# Patient Record
Sex: Female | Born: 1970 | Race: White | Hispanic: No | Smoking: Never smoker
Health system: Southern US, Community
[De-identification: ages and names within clinical notes are randomized; demographics above are authoritative.]

## PROBLEM LIST (undated history)

## (undated) DIAGNOSIS — C801 Malignant (primary) neoplasm, unspecified: Secondary | ICD-10-CM

## (undated) HISTORY — PX: ABDOMINAL HYSTERECTOMY: SHX81

## (undated) HISTORY — PX: LEG TENDON SURGERY: SHX1004

---

## 2003-12-29 ENCOUNTER — Other Ambulatory Visit: Admission: RE | Admit: 2003-12-29 | Discharge: 2003-12-29 | Payer: Self-pay | Admitting: Family Medicine

## 2005-02-15 ENCOUNTER — Ambulatory Visit (HOSPITAL_COMMUNITY): Admission: RE | Admit: 2005-02-15 | Discharge: 2005-02-15 | Payer: Self-pay | Admitting: *Deleted

## 2005-06-01 ENCOUNTER — Ambulatory Visit (HOSPITAL_COMMUNITY): Admission: RE | Admit: 2005-06-01 | Discharge: 2005-06-01 | Payer: Self-pay | Admitting: *Deleted

## 2008-08-19 ENCOUNTER — Ambulatory Visit (HOSPITAL_COMMUNITY): Admission: RE | Admit: 2008-08-19 | Discharge: 2008-08-19 | Payer: Self-pay | Admitting: Family Medicine

## 2008-10-08 ENCOUNTER — Encounter: Admission: RE | Admit: 2008-10-08 | Discharge: 2008-10-08 | Payer: Self-pay | Admitting: Family Medicine

## 2009-06-03 ENCOUNTER — Emergency Department (HOSPITAL_COMMUNITY): Admission: EM | Admit: 2009-06-03 | Discharge: 2009-06-03 | Payer: Self-pay | Admitting: Emergency Medicine

## 2010-12-26 ENCOUNTER — Encounter
Admission: RE | Admit: 2010-12-26 | Discharge: 2010-12-26 | Payer: Self-pay | Source: Home / Self Care | Attending: Family Medicine | Admitting: Family Medicine

## 2010-12-31 ENCOUNTER — Encounter: Payer: Self-pay | Admitting: *Deleted

## 2010-12-31 ENCOUNTER — Encounter: Payer: Self-pay | Admitting: Family Medicine

## 2011-04-27 NOTE — Op Note (Signed)
Mallory Taylor, Mallory Taylor             ACCOUNT NO.:  [id number]   MEDICAL RECORD NO.:  [id number]          PATIENT TYPE:  AMB   LOCATION:  SDC                           FACILITY:  [facility]   PHYSICIAN:  Wauna B. Earlene Plater, M.D.  DATE OF BIRTH:  May 05, 1971   DATE OF PROCEDURE:  02/15/2005  DATE OF DISCHARGE:                                 OPERATIVE REPORT   PREOPERATIVE DIAGNOSIS:  Desires tubal sterilization.   POSTOPERATIVE DIAGNOSIS:  Desires tubal sterilization.   PROCEDURE:  Essure tubal sterilization.   ASSISTANT:  None.   ANESTHESIA:  LMA.   SPECIMENS:  None.   ESTIMATED BLOOD LOSS:  Minimal.   FLUID DEFICIT:  Minimal.   FINDINGS:  Normal-appearing intrauterine cavity.  Good endometrial  suppression.  Good visualization of both tubal ostia.  Post Essureplacement,  three coils visible on the left, one coil visible on the right.  No  complications.   INDICATION:  The patient desires permanent tubal sterilization.  After  discussing the various options that are available to her, the patient  decided to proceed with the Essure procedure.  We discussed the need for 3  months of back up and the FDA-mandated postop HSG to confirm tubal  occlusion.  The patient was advised of the risks of surgery including  infection, bleeding, uterine perforation, damage to surrounding organs.   PROCEDURE:  The patient was taken to the operating room with LMA general  anesthesia in place.  She was prepped and draped in the standard fashion.  An in-and-out catheter was used to empty the bladder.   Exam under anesthesia showed an anteverted normal-sized uterus with no  adnexal masses.   A single-tooth tenaculum was attached to the anterior lip of the cervix.  The 30 degree scope was inserted and the endometrium distended with  sorbitol.  The tubal ostia and uterine cavity were inspected and found to be  normal.  Left tubal ostia was first approached.  The Essure was inserted up  to the black depth  mark.  It was deployed using the standard maneuvers.  Three coils were visible after placement.  The procedure was repeated on the  right side.  There was substantial corkscrew forward movement of the coils  as the coil was deployed.  This left one coil visible after placement.   The patient tolerated the procedure well.  There were no complications.  She  was taken to the recovery room awake and in stable condition.      WBD/MEDQ  D:  02/15/2005  T:  02/15/2005  Job:  [id number]

## 2012-02-05 ENCOUNTER — Other Ambulatory Visit: Payer: Self-pay | Admitting: Family Medicine

## 2012-02-05 DIAGNOSIS — N6310 Unspecified lump in the right breast, unspecified quadrant: Secondary | ICD-10-CM

## 2012-02-12 ENCOUNTER — Other Ambulatory Visit: Payer: Self-pay

## 2012-02-13 ENCOUNTER — Ambulatory Visit
Admission: RE | Admit: 2012-02-13 | Discharge: 2012-02-13 | Disposition: A | Discharge status: Home / Self Care | Payer: BC Managed Care – PPO | Source: Ambulatory Visit | Attending: Family Medicine | Admitting: Family Medicine

## 2012-02-13 DIAGNOSIS — N6310 Unspecified lump in the right breast, unspecified quadrant: Secondary | ICD-10-CM

## 2012-10-03 ENCOUNTER — Other Ambulatory Visit: Payer: Self-pay | Admitting: Family Medicine

## 2012-10-03 DIAGNOSIS — N6310 Unspecified lump in the right breast, unspecified quadrant: Secondary | ICD-10-CM

## 2012-10-24 ENCOUNTER — Ambulatory Visit
Admission: RE | Admit: 2012-10-24 | Discharge: 2012-10-24 | Disposition: A | Discharge status: Home / Self Care | Payer: BC Managed Care – PPO | Source: Ambulatory Visit | Attending: Family Medicine | Admitting: Family Medicine

## 2012-10-24 DIAGNOSIS — N6310 Unspecified lump in the right breast, unspecified quadrant: Secondary | ICD-10-CM

## 2013-04-08 ENCOUNTER — Other Ambulatory Visit: Payer: Self-pay | Admitting: Family Medicine

## 2013-04-08 DIAGNOSIS — N63 Unspecified lump in unspecified breast: Secondary | ICD-10-CM

## 2013-04-23 ENCOUNTER — Ambulatory Visit
Admission: RE | Admit: 2013-04-23 | Discharge: 2013-04-23 | Disposition: A | Discharge status: Home / Self Care | Payer: BC Managed Care – PPO | Source: Ambulatory Visit | Attending: Family Medicine | Admitting: Family Medicine

## 2013-04-23 DIAGNOSIS — N63 Unspecified lump in unspecified breast: Secondary | ICD-10-CM

## 2013-10-02 ENCOUNTER — Other Ambulatory Visit: Payer: Self-pay | Admitting: Family Medicine

## 2013-10-02 DIAGNOSIS — N63 Unspecified lump in unspecified breast: Secondary | ICD-10-CM

## 2013-10-26 ENCOUNTER — Ambulatory Visit
Admission: RE | Admit: 2013-10-26 | Discharge: 2013-10-26 | Disposition: A | Discharge status: Home / Self Care | Payer: BC Managed Care – PPO | Source: Ambulatory Visit | Attending: Family Medicine | Admitting: Family Medicine

## 2013-10-26 DIAGNOSIS — N63 Unspecified lump in unspecified breast: Secondary | ICD-10-CM

## 2014-05-18 ENCOUNTER — Other Ambulatory Visit: Payer: Self-pay | Admitting: Family Medicine

## 2014-05-18 DIAGNOSIS — N6310 Unspecified lump in the right breast, unspecified quadrant: Secondary | ICD-10-CM

## 2014-05-28 ENCOUNTER — Ambulatory Visit
Admission: RE | Admit: 2014-05-28 | Discharge: 2014-05-28 | Disposition: A | Discharge status: Home / Self Care | Payer: BC Managed Care – PPO | Source: Ambulatory Visit | Attending: Family Medicine | Admitting: Family Medicine

## 2014-05-28 DIAGNOSIS — N6310 Unspecified lump in the right breast, unspecified quadrant: Secondary | ICD-10-CM

## 2015-04-13 ENCOUNTER — Other Ambulatory Visit: Payer: Self-pay | Admitting: Physician Assistant

## 2015-04-13 DIAGNOSIS — Z1231 Encounter for screening mammogram for malignant neoplasm of breast: Secondary | ICD-10-CM

## 2015-05-25 ENCOUNTER — Other Ambulatory Visit: Payer: Self-pay | Admitting: Physician Assistant

## 2015-05-25 DIAGNOSIS — R921 Mammographic calcification found on diagnostic imaging of breast: Secondary | ICD-10-CM

## 2015-05-30 ENCOUNTER — Ambulatory Visit
Admission: RE | Admit: 2015-05-30 | Discharge: 2015-05-30 | Disposition: A | Discharge status: Home / Self Care | Payer: BLUE CROSS/BLUE SHIELD | Source: Ambulatory Visit | Attending: Physician Assistant | Admitting: Physician Assistant

## 2015-05-30 ENCOUNTER — Ambulatory Visit: Payer: Self-pay

## 2015-05-30 DIAGNOSIS — R921 Mammographic calcification found on diagnostic imaging of breast: Secondary | ICD-10-CM

## 2015-08-04 ENCOUNTER — Other Ambulatory Visit: Payer: Self-pay

## 2015-08-04 ENCOUNTER — Other Ambulatory Visit: Payer: Self-pay | Admitting: Family Medicine

## 2015-08-04 DIAGNOSIS — R921 Mammographic calcification found on diagnostic imaging of breast: Secondary | ICD-10-CM

## 2015-12-01 ENCOUNTER — Ambulatory Visit
Admission: RE | Admit: 2015-12-01 | Discharge: 2015-12-01 | Disposition: A | Discharge status: Home / Self Care | Payer: BLUE CROSS/BLUE SHIELD | Source: Ambulatory Visit | Attending: Family Medicine | Admitting: Family Medicine

## 2015-12-01 DIAGNOSIS — R921 Mammographic calcification found on diagnostic imaging of breast: Secondary | ICD-10-CM

## 2016-03-07 ENCOUNTER — Other Ambulatory Visit: Payer: Self-pay | Admitting: Nurse Practitioner

## 2016-03-07 DIAGNOSIS — N63 Unspecified lump in unspecified breast: Secondary | ICD-10-CM

## 2016-03-14 ENCOUNTER — Ambulatory Visit
Admission: RE | Admit: 2016-03-14 | Discharge: 2016-03-14 | Disposition: A | Discharge status: Home / Self Care | Payer: BLUE CROSS/BLUE SHIELD | Source: Ambulatory Visit | Attending: Nurse Practitioner | Admitting: Nurse Practitioner

## 2016-03-14 ENCOUNTER — Other Ambulatory Visit: Payer: Self-pay | Admitting: Nurse Practitioner

## 2016-03-14 DIAGNOSIS — N63 Unspecified lump in unspecified breast: Secondary | ICD-10-CM

## 2016-07-17 ENCOUNTER — Other Ambulatory Visit: Payer: Self-pay | Admitting: Nurse Practitioner

## 2016-07-17 DIAGNOSIS — R921 Mammographic calcification found on diagnostic imaging of breast: Secondary | ICD-10-CM

## 2016-07-24 ENCOUNTER — Ambulatory Visit
Admission: RE | Admit: 2016-07-24 | Discharge: 2016-07-24 | Disposition: A | Discharge status: Home / Self Care | Payer: BLUE CROSS/BLUE SHIELD | Source: Ambulatory Visit | Attending: Nurse Practitioner | Admitting: Nurse Practitioner

## 2016-07-24 DIAGNOSIS — R921 Mammographic calcification found on diagnostic imaging of breast: Secondary | ICD-10-CM

## 2017-08-14 ENCOUNTER — Other Ambulatory Visit: Payer: Self-pay | Admitting: Nurse Practitioner

## 2017-08-14 DIAGNOSIS — Z1231 Encounter for screening mammogram for malignant neoplasm of breast: Secondary | ICD-10-CM

## 2017-08-19 ENCOUNTER — Ambulatory Visit
Admission: RE | Admit: 2017-08-19 | Discharge: 2017-08-19 | Disposition: A | Discharge status: Home / Self Care | Payer: BLUE CROSS/BLUE SHIELD | Source: Ambulatory Visit | Attending: Nurse Practitioner | Admitting: Nurse Practitioner

## 2017-08-19 ENCOUNTER — Encounter: Payer: Self-pay | Admitting: Radiology

## 2017-08-19 DIAGNOSIS — Z1231 Encounter for screening mammogram for malignant neoplasm of breast: Secondary | ICD-10-CM

## 2018-08-08 ENCOUNTER — Other Ambulatory Visit: Payer: Self-pay | Admitting: Nurse Practitioner

## 2018-08-08 DIAGNOSIS — Z1231 Encounter for screening mammogram for malignant neoplasm of breast: Secondary | ICD-10-CM

## 2018-09-11 ENCOUNTER — Ambulatory Visit
Admission: RE | Admit: 2018-09-11 | Discharge: 2018-09-11 | Disposition: A | Discharge status: Home / Self Care | Payer: BLUE CROSS/BLUE SHIELD | Source: Ambulatory Visit | Attending: Nurse Practitioner | Admitting: Nurse Practitioner

## 2018-09-11 DIAGNOSIS — Z1231 Encounter for screening mammogram for malignant neoplasm of breast: Secondary | ICD-10-CM

## 2019-11-27 ENCOUNTER — Other Ambulatory Visit: Payer: Self-pay

## 2019-11-27 ENCOUNTER — Emergency Department (HOSPITAL_BASED_OUTPATIENT_CLINIC_OR_DEPARTMENT_OTHER): Payer: BC Managed Care – PPO

## 2019-11-27 ENCOUNTER — Other Ambulatory Visit: Payer: Self-pay | Admitting: Nurse Practitioner

## 2019-11-27 ENCOUNTER — Encounter (HOSPITAL_COMMUNITY): Payer: Self-pay | Admitting: Emergency Medicine

## 2019-11-27 ENCOUNTER — Emergency Department (HOSPITAL_COMMUNITY)
Admission: EM | Admit: 2019-11-27 | Discharge: 2019-11-27 | Disposition: A | Discharge status: Home / Self Care | Payer: BC Managed Care – PPO | Attending: Emergency Medicine | Admitting: Emergency Medicine

## 2019-11-27 DIAGNOSIS — M79609 Pain in unspecified limb: Secondary | ICD-10-CM | POA: Diagnosis not present

## 2019-11-27 DIAGNOSIS — Z9889 Other specified postprocedural states: Secondary | ICD-10-CM | POA: Insufficient documentation

## 2019-11-27 DIAGNOSIS — I82451 Acute embolism and thrombosis of right peroneal vein: Secondary | ICD-10-CM | POA: Insufficient documentation

## 2019-11-27 DIAGNOSIS — M79604 Pain in right leg: Secondary | ICD-10-CM | POA: Diagnosis present

## 2019-11-27 DIAGNOSIS — Z1231 Encounter for screening mammogram for malignant neoplasm of breast: Secondary | ICD-10-CM

## 2019-11-27 LAB — I-STAT CHEM 8, ED
BUN: 14 mg/dL (ref 6–20)
Calcium, Ion: 1.31 mmol/L (ref 1.15–1.40)
Chloride: 108 mmol/L (ref 98–111)
Creatinine, Ser: 1 mg/dL (ref 0.44–1.00)
Glucose, Bld: 98 mg/dL (ref 70–99)
HCT: 40 % (ref 36.0–46.0)
Hemoglobin: 13.6 g/dL (ref 12.0–15.0)
Potassium: 3.9 mmol/L (ref 3.5–5.1)
Sodium: 142 mmol/L (ref 135–145)
TCO2: 26 mmol/L (ref 22–32)

## 2019-11-27 MED ORDER — RIVAROXABAN 20 MG PO TABS: 20.0000 mg | ORAL_TABLET | Freq: Every day | ORAL | Status: DC

## 2019-11-27 MED ORDER — RIVAROXABAN 15 MG PO TABS
15.0000 mg | ORAL_TABLET | Freq: Two times a day (BID) | ORAL | Status: DC
  Administered 2019-11-27: 22:00:00 15 mg via ORAL
  Filled 2019-11-27 (×2): qty 1

## 2019-11-27 MED ORDER — RIVAROXABAN (XARELTO) VTE STARTER PACK (15 & 20 MG): ORAL_TABLET | ORAL | 0 refills | Status: AC

## 2019-11-27 NOTE — Progress Notes (Signed)
[facility] for Xarelto Indication: DVT  No Known Allergies  Patient Measurements:  No height, weight charted   Vital Signs: Temp: 98.4 F (36.9 C) (12/18 1811) Temp Source: Oral (12/18 1811) BP: 158/96 (12/18 1811) Pulse Rate: 110 (12/18 1811)  Labs: Recent Labs    11/27/19 2020  HGB 13.6  HCT 40.0  CREATININE 1.00   CrCl cannot be calculated (Unknown ideal weight.).  Medical History: History reviewed. No pertinent past medical history.  Medications:  Scheduled:   Assessment: 48 yoF with new RLE DVT. Previous tendon repair, plantar fasciatis 12/8. Xarelto for VTE  Goal of Therapy:  Monitor platelets by anticoagulation protocol: Yes   Plan:  Xarelto 15mg  bid x 21 days, followed by 20mg  daily with a meal  Minda Ditto PharmD 11/27/2019,8:53 PM

## 2019-11-27 NOTE — ED Provider Notes (Signed)
[facility] DEPT Provider Note   CSN: [id number] Arrival date & time: 11/27/19  1800     History Chief Complaint  Patient presents with  . Leg Pain    Mallory Taylor is a 48 y.o. female.  The history is provided by the patient. No language interpreter was used.  Leg Pain Associated symptoms: no fever        48 year old female presenting complaining of right leg pain.  Patient reports she had leg tendon surgery 10 days ago where she had her tendon replaced.  Today she developed pain to her calf which radiates to her right thigh.  She described pain as a throbbing achy sensation, moderate in severity, new compared to prior.  No associate chest pain or shortness of breath no lightheadedness or dizziness no new numbness.  She is here at the urging of her friend to be evaluated for DVT.  She denies any prior DVT in the past.  No specific treatment tried.  Patient states her surgery went well, she was recovering well and pain was steadily improving until today.  She denies any new injury.  History reviewed. No pertinent past medical history.  There are no problems to display for this patient.   Past Surgical History:  Procedure Laterality Date  . LEG TENDON SURGERY       OB History   No obstetric history on file.     Family History  Problem Relation Age of Onset  . Breast cancer Maternal Grandfather     Social History   Tobacco Use  . Smoking status: Not on file  Substance Use Topics  . Alcohol use: Not on file  . Drug use: Not on file    Home Medications Prior to Admission medications   Not on File    Allergies    Patient has no known allergies.  Review of Systems   Review of Systems  Constitutional: Negative for fever.  Musculoskeletal: Positive for myalgias.  Neurological: Negative for numbness.    Physical Exam Updated Vital Signs BP (!) 158/96 (BP Location: Left Arm)   Pulse (!) 110   Temp 98.4 F (36.9 C) (Oral)    Resp 18   SpO2 100%   Physical Exam Vitals and nursing note reviewed.  Constitutional:      General: She is not in acute distress.    Appearance: She is well-developed.  HENT:     Head: Atraumatic.  Eyes:     Conjunctiva/sclera: Conjunctivae normal.  Cardiovascular:     Rate and Rhythm: Tachycardia present.     Pulses: Normal pulses.     Heart sounds: Normal heart sounds.  Pulmonary:     Effort: Pulmonary effort is normal.     Breath sounds: Normal breath sounds.  Abdominal:     Palpations: Abdomen is soft.     Tenderness: There is no abdominal tenderness.  Musculoskeletal:     Cervical back: Neck supple.     Comments: Right lower extremity: Short leg splint in place.  Brisk cap refill to toes, calf compartment is soft but tender.  Thigh compartments soft.  Skin without signs of infection.  Well-healing surgical scar.  Dorsalis pedis pulse palpable.  Skin:    Findings: No rash.  Neurological:     Mental Status: She is alert.     ED Results / Procedures / Treatments   Labs (all labs ordered are listed, but only abnormal results are displayed) Labs Reviewed  I-STAT CHEM 8, ED  EKG None  Radiology No results found.  Procedures Procedures (including critical care time)  Medications Ordered in ED Medications - No data to display  ED Course  I have reviewed the triage vital signs and the nursing notes.  Pertinent labs & imaging results that were available during my care of the patient were reviewed by me and considered in my medical decision making (see chart for details).    MDM Rules/Calculators/A&P                      BP (!) 148/101   Pulse 86   Temp 98.4 F (36.9 C) (Oral)   Resp 19   SpO2 100%   Final Clinical Impression(s) / ED Diagnoses Final diagnoses:  Acute deep vein thrombosis (DVT) of right peroneal vein ([place])    Rx / DC Orders ED Discharge Orders    None     6:53 PM Patient recently had right lower extremity surgery to replace  a torn tendon presenting today complaining of increasing pain to her calf and thigh.  No evidence of infection on exam, will obtain Doppler study to rule out DVT.  3:07 PM Evidence of DVT on [place], pt started on xarelto, appropriate instruction provided.  Pt to f/u with her orthopedist for further management as well.     Domenic Moras, PA-C 11/30/19 1508    Ezequiel Essex, MD 11/30/19 1700

## 2019-11-27 NOTE — ED Notes (Signed)
US at bedside

## 2019-11-27 NOTE — Progress Notes (Signed)
Right lower extremity venous duplex completed. Refer to "CV Proc" under chart review to view preliminary results.  Preliminary results discussed with Domenic Moras, PA-C.  11/27/2019 8:04 PM Kelby Aline., MHA, RVT, RDCS, RDMS

## 2019-11-27 NOTE — ED Notes (Signed)
Pharmacy at bedside

## 2019-11-27 NOTE — Discharge Instructions (Addendum)
Information on my medicine - XARELTO (rivaroxaban)  This medication education was reviewed with me or my healthcare representative as part of my discharge preparation.  The pharmacist that spoke with me during my hospital stay was:   WHY WAS XARELTO PRESCRIBED FOR YOU? Xarelto was prescribed to treat blood clots that may have been found in the veins of your legs (deep vein thrombosis) or in your lungs (pulmonary embolism) and to reduce the risk of them occurring again.  What do you need to know about Xarelto? The starting dose is one 15 mg tablet taken TWICE daily with food for the FIRST 21 DAYS then on (enter date)  12/19/2019 am  the dose is changed to one 20 mg tablet taken ONCE A DAY with breakfast  DO NOT stop taking Xarelto without talking to the health care provider who prescribed the medication.  Refill your prescription for 20 mg tablets before you run out.  After discharge, you should have regular check-up appointments with your healthcare provider that is prescribing your Xarelto.  In the future your dose may need to be changed if your kidney function changes by a significant amount.  What do you do if you miss a dose? If you are taking Xarelto TWICE DAILY and you miss a dose, take it as soon as you remember. You may take two 15 mg tablets (total 30 mg) at the same time then resume your regularly scheduled 15 mg twice daily the next day.  If you are taking Xarelto ONCE DAILY and you miss a dose, take it as soon as you remember on the same day then continue your regularly scheduled once daily regimen the next day. Do not take two doses of Xarelto at the same time.   Important Safety Information Xarelto is a blood thinner medicine that can cause bleeding. You should call your healthcare provider right away if you experience any of the following: ? Bleeding from an injury or your nose that does not stop. ? Unusual colored urine (red or dark brown) or unusual colored stools (red  or black). ? Unusual bruising for unknown reasons. ? A serious fall or if you hit your head (even if there is no bleeding).  Some medicines may interact with Xarelto and might increase your risk of bleeding while on Xarelto. To help avoid this, consult your healthcare provider or pharmacist prior to using any new prescription or non-prescription medications, including herbals, vitamins, non-steroidal anti-inflammatory drugs (NSAIDs) and supplements.  This website has more information on Xarelto: [email or web address]-benson.[email or web address]

## 2019-11-27 NOTE — ED Triage Notes (Signed)
Pt reports that had surgery on 12/8 where had tendons replaced, cut down toe and heel, fixed plantar fasciatus. Reports today having right calf and right thigh pains. Denies any new injury to leg.

## 2019-12-01 ENCOUNTER — Ambulatory Visit
Admission: RE | Admit: 2019-12-01 | Discharge: 2019-12-01 | Disposition: A | Discharge status: Home / Self Care | Payer: BC Managed Care – PPO | Source: Ambulatory Visit | Attending: Nurse Practitioner | Admitting: Nurse Practitioner

## 2019-12-01 ENCOUNTER — Other Ambulatory Visit: Payer: Self-pay

## 2019-12-01 DIAGNOSIS — Z1231 Encounter for screening mammogram for malignant neoplasm of breast: Secondary | ICD-10-CM

## 2021-06-28 ENCOUNTER — Emergency Department (HOSPITAL_BASED_OUTPATIENT_CLINIC_OR_DEPARTMENT_OTHER): Payer: BC Managed Care – PPO

## 2021-06-28 ENCOUNTER — Other Ambulatory Visit: Payer: Self-pay

## 2021-06-28 ENCOUNTER — Encounter (HOSPITAL_BASED_OUTPATIENT_CLINIC_OR_DEPARTMENT_OTHER): Payer: Self-pay | Admitting: *Deleted

## 2021-06-28 ENCOUNTER — Emergency Department (HOSPITAL_BASED_OUTPATIENT_CLINIC_OR_DEPARTMENT_OTHER)
Admission: EM | Admit: 2021-06-28 | Discharge: 2021-06-28 | Disposition: A | Discharge status: Home / Self Care | Payer: BC Managed Care – PPO | Attending: Emergency Medicine | Admitting: Emergency Medicine

## 2021-06-28 DIAGNOSIS — R110 Nausea: Secondary | ICD-10-CM | POA: Diagnosis not present

## 2021-06-28 DIAGNOSIS — S060X0A Concussion without loss of consciousness, initial encounter: Secondary | ICD-10-CM | POA: Diagnosis not present

## 2021-06-28 DIAGNOSIS — R42 Dizziness and giddiness: Secondary | ICD-10-CM | POA: Diagnosis not present

## 2021-06-28 DIAGNOSIS — W01198A Fall on same level from slipping, tripping and stumbling with subsequent striking against other object, initial encounter: Secondary | ICD-10-CM | POA: Insufficient documentation

## 2021-06-28 DIAGNOSIS — S0990XA Unspecified injury of head, initial encounter: Secondary | ICD-10-CM | POA: Diagnosis present

## 2021-06-28 DIAGNOSIS — Z7982 Long term (current) use of aspirin: Secondary | ICD-10-CM | POA: Diagnosis not present

## 2021-06-28 DIAGNOSIS — Z7901 Long term (current) use of anticoagulants: Secondary | ICD-10-CM | POA: Insufficient documentation

## 2021-06-28 MED ORDER — PROMETHAZINE HCL 25 MG PO TABS
25.0000 mg | ORAL_TABLET | Freq: Four times a day (QID) | ORAL | 0 refills | Status: AC | PRN
Start: 2021-06-28 — End: ?

## 2021-06-28 MED ORDER — DROPERIDOL 2.5 MG/ML IJ SOLN
2.5000 mg | Freq: Once | INTRAMUSCULAR | Status: AC
  Administered 2021-06-28: 2.5 mg via INTRAMUSCULAR
  Filled 2021-06-28: qty 2

## 2021-06-28 NOTE — ED Notes (Signed)
Patient transported to CT 

## 2021-06-28 NOTE — ED Triage Notes (Signed)
C/o head injury x 4 days ago , denies LOC , c/o h/a
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
## 2022-03-22 ENCOUNTER — Emergency Department (HOSPITAL_BASED_OUTPATIENT_CLINIC_OR_DEPARTMENT_OTHER)
Admission: EM | Admit: 2022-03-22 | Discharge: 2022-03-22 | Disposition: A | Discharge status: Home / Self Care | Payer: BC Managed Care – PPO | Attending: Emergency Medicine | Admitting: Emergency Medicine

## 2022-03-22 ENCOUNTER — Other Ambulatory Visit: Payer: Self-pay

## 2022-03-22 ENCOUNTER — Emergency Department (HOSPITAL_BASED_OUTPATIENT_CLINIC_OR_DEPARTMENT_OTHER): Payer: BC Managed Care – PPO

## 2022-03-22 ENCOUNTER — Encounter (HOSPITAL_BASED_OUTPATIENT_CLINIC_OR_DEPARTMENT_OTHER): Payer: Self-pay | Admitting: Emergency Medicine

## 2022-03-22 DIAGNOSIS — R112 Nausea with vomiting, unspecified: Secondary | ICD-10-CM | POA: Diagnosis present

## 2022-03-22 DIAGNOSIS — R6883 Chills (without fever): Secondary | ICD-10-CM | POA: Diagnosis not present

## 2022-03-22 DIAGNOSIS — R059 Cough, unspecified: Secondary | ICD-10-CM | POA: Insufficient documentation

## 2022-03-22 DIAGNOSIS — R197 Diarrhea, unspecified: Secondary | ICD-10-CM | POA: Insufficient documentation

## 2022-03-22 DIAGNOSIS — Z8616 Personal history of COVID-19: Secondary | ICD-10-CM | POA: Diagnosis not present

## 2022-03-22 DIAGNOSIS — R0981 Nasal congestion: Secondary | ICD-10-CM | POA: Diagnosis not present

## 2022-03-22 LAB — COMPREHENSIVE METABOLIC PANEL
ALT: 25 U/L (ref 0–44)
AST: 28 U/L (ref 15–41)
Albumin: 3.8 g/dL (ref 3.5–5.0)
Alkaline Phosphatase: 60 U/L (ref 38–126)
Anion gap: 10 (ref 5–15)
BUN: 16 mg/dL (ref 6–20)
CO2: 25 mmol/L (ref 22–32)
Calcium: 9.2 mg/dL (ref 8.9–10.3)
Chloride: 105 mmol/L (ref 98–111)
Creatinine, Ser: 0.82 mg/dL (ref 0.44–1.00)
GFR, Estimated: >60 mL/min (ref >60)
Glucose, Bld: 80 mg/dL (ref 70–99)
Potassium: 3.5 mmol/L (ref 3.5–5.1)
Sodium: 140 mmol/L (ref 135–145)
Total Bilirubin: 1.1 mg/dL (ref 0.3–1.2)
Total Protein: 7.4 g/dL (ref 6.5–8.1)

## 2022-03-22 LAB — URINALYSIS, ROUTINE W REFLEX MICROSCOPIC
Glucose, UA: NEGATIVE mg/dL
Ketones, ur: 40 mg/dL — AB
Leukocytes,Ua: NEGATIVE
Nitrite: NEGATIVE
Protein, ur: 30 mg/dL — AB
Specific Gravity, Urine: 1.015 (ref 1.005–1.030)
pH: 5.5 (ref 5.0–8.0)

## 2022-03-22 LAB — URINALYSIS, MICROSCOPIC (REFLEX)

## 2022-03-22 LAB — CBC WITH DIFFERENTIAL/PLATELET
Abs Immature Granulocytes: 0.01 10*3/uL (ref 0.00–0.07)
Basophils Absolute: 0 10*3/uL (ref 0.0–0.1)
Basophils Relative: 0 %
Eosinophils Absolute: 0.1 10*3/uL (ref 0.0–0.5)
Eosinophils Relative: 2 %
HCT: 42.2 % (ref 36.0–46.0)
Hemoglobin: 13.8 g/dL (ref 12.0–15.0)
Immature Granulocytes: 0 %
Lymphocytes Relative: 43 %
Lymphs Abs: 2.3 10*3/uL (ref 0.7–4.0)
MCH: 28.5 pg (ref 26.0–34.0)
MCHC: 32.7 g/dL (ref 30.0–36.0)
MCV: 87 fL (ref 80.0–100.0)
Monocytes Absolute: 0.4 10*3/uL (ref 0.1–1.0)
Monocytes Relative: 8 %
Neutro Abs: 2.5 10*3/uL (ref 1.7–7.7)
Neutrophils Relative %: 47 %
Platelets: 226 10*3/uL (ref 150–400)
RBC: 4.85 MIL/uL (ref 3.87–5.11)
RDW: 12.8 % (ref 11.5–15.5)
WBC: 5.3 10*3/uL (ref 4.0–10.5)
nRBC: 0 % (ref 0.0–0.2)

## 2022-03-22 LAB — LIPASE, BLOOD: Lipase: 44 U/L (ref 11–51)

## 2022-03-22 MED ORDER — ONDANSETRON 8 MG PO TBDP
8.0000 mg | ORAL_TABLET | Freq: Three times a day (TID) | ORAL | 0 refills | Status: AC | PRN
Start: 2022-03-22 — End: 2022-03-29

## 2022-03-22 MED ORDER — ONDANSETRON HCL 4 MG/2ML IJ SOLN
4.0000 mg | Freq: Once | INTRAMUSCULAR | Status: AC
Start: 2022-03-22 — End: 2022-03-22
  Administered 2022-03-22: 4 mg via INTRAVENOUS
  Filled 2022-03-22: qty 2

## 2022-03-22 MED ORDER — METOCLOPRAMIDE HCL 5 MG/ML IJ SOLN
10.0000 mg | Freq: Once | INTRAMUSCULAR | Status: AC
  Administered 2022-03-22: 10 mg via INTRAVENOUS
  Filled 2022-03-22: qty 2

## 2022-03-22 MED ORDER — SODIUM CHLORIDE 0.9 % IV BOLUS
1000.0000 mL | Freq: Once | INTRAVENOUS | Status: AC
  Administered 2022-03-22: 1000 mL via INTRAVENOUS

## 2022-03-22 MED ORDER — HEPARIN SOD (PORK) LOCK FLUSH 100 UNIT/ML IV SOLN
INTRAVENOUS | Status: AC
  Filled 2022-03-22: qty 5

## 2022-03-22 MED ORDER — IOHEXOL 300 MG/ML  SOLN
100.0000 mL | Freq: Once | INTRAMUSCULAR | Status: AC | PRN
  Administered 2022-03-22: 100 mL via INTRAVENOUS

## 2022-03-22 NOTE — ED Notes (Signed)
PO/Fluid Challenge -- Pt attempted to drink water, after taking a few sips, she felt like she was going to vomit. Provider notified.  ?

## 2022-03-22 NOTE — ED Triage Notes (Signed)
Pt here from home with n/v related to covid, tested positive for covid on Monday . Md sent pt over for fluids  ?

## 2022-03-22 NOTE — ED Notes (Signed)
Clients PowerPort accessed with 20g/1inch needle, attempt x 1, able to flush with ease, blood return easily obtained. Labs obtained after dsg applied. Antimicrobial Disc applied with tegaderm dsg securely in place. Client tolerated procedure very well, no complications noted during access ?

## 2022-03-22 NOTE — ED Notes (Signed)
Patient transported to CT 

## 2022-03-22 NOTE — ED Provider Notes (Signed)
?[name] ?Provider Note ? ? ?CSN: [id number] ?Arrival date & time: 03/22/22  1150 ? ?  ? ?History ?Past medical history of follicular lymphoma currently in remission ? ?Chief Complaint  ?Patient presents with  ?? Emesis  ?  + Covid 03/19/22  ? ? ?Mallory Taylor is a 51 y.o. female. ?Patient presents the emergency department with a chief complaint of nausea and vomiting.  She says that she was in her normal state of health since last Friday.  On Friday, she started noticing some cough, congestion, and sore throat.  On Saturday night she was able to eat normally.  On Sunday, she started having nausea and vomiting.  It was to the point where she was not able to keep anything down.  She went to see her primary care doctor on Monday.  She was tested positive for COVID-19 at that time.  She was given prescription for Zofran and Compazine for her nausea.  Patient states that she has not been able to tolerate any p.o. since last Saturday.  Anytime she tries to eat something whether it is crackers, soup, or fluids she generally vomits back up with in 1 hour after eating.  She has had some intermittent diarrhea this week as well, however only about once a day as she is not really eating or drinking anything.  Her last solid bowel movement was prior to last Saturday.  She says that she has been having some night sweats and has had a 20 pound weight loss since last Thursday.  She is concerned because of her history of follicular lymphoma and that 2 of her B symptoms are weight loss and night sweats.  She still has the productive cough with yellow sputum, but no shortness of breath or chest pain.  Denies any history of abdominal surgeries.  She does still have her right Port-A-Cath from her lymphoma that is in working condition. ? ? ?Emesis ?Associated symptoms: chills, cough and diarrhea   ?Associated symptoms: no abdominal pain, no fever and no sore throat   ? ?  ? ?Home Medications ?Prior to  Admission medications   ?Medication Sig Start Date End Date Taking? Authorizing Provider  ?promethazine (PHENERGAN) 25 MG tablet Take 1 tablet (25 mg total) by mouth every 6 (six) hours as needed for nausea or vomiting. 06/28/21   Quintella Reichert, MD  ?Rivaroxaban 15 & 20 MG TBPK Follow package directions: Take one '15mg'$  tablet by mouth twice a day. On day 22, switch to one '20mg'$  tablet once a day. Take with food. 11/27/19   Domenic Moras, PA-C  ?   ? ?Allergies    ?Other and Tixagevimab & cilgavimab   ? ?Review of Systems   ?Review of Systems  ?Constitutional:  Positive for appetite change, chills and unexpected weight change. Negative for fever.  ?HENT:  Positive for congestion. Negative for sore throat.   ?Respiratory:  Positive for cough. Negative for shortness of breath.   ?Cardiovascular:  Negative for chest pain.  ?Gastrointestinal:  Positive for constipation, diarrhea, nausea and vomiting. Negative for abdominal distention, abdominal pain and anal bleeding.  ?Genitourinary:  Negative for dysuria and hematuria.  ?All other systems reviewed and are negative. ? ?Physical Exam ?Updated Vital Signs ?BP 126/73   Pulse 76   Temp 98.7 ?F (37.1 ?C) (Oral)   Resp 19   Ht '5\' 9"'$  (1.753 m)   Wt 105.7 kg   SpO2 98%   BMI 34.41 kg/m?  ?Physical Exam ?Vitals and  nursing note reviewed.  ?Constitutional:   ?   General: She is not in acute distress. ?   Appearance: Normal appearance. She is not ill-appearing, toxic-appearing or diaphoretic.  ?HENT:  ?   Head: Normocephalic and atraumatic.  ?   Right Ear: Tympanic membrane, ear canal and external ear normal. There is no impacted cerumen.  ?   Left Ear: Tympanic membrane, ear canal and external ear normal. There is no impacted cerumen.  ?   Nose: Congestion present. No nasal deformity.  ?   Mouth/Throat:  ?   Lips: Pink. No lesions.  ?   Mouth: Mucous membranes are moist. No injury, lacerations, oral lesions or angioedema.  ?   Pharynx: Oropharynx is clear. Uvula midline. No  pharyngeal swelling, oropharyngeal exudate, posterior oropharyngeal erythema or uvula swelling.  ?Eyes:  ?   General: Gaze aligned appropriately. No scleral icterus.    ?   Right eye: No discharge.     ?   Left eye: No discharge.  ?   Conjunctiva/sclera: Conjunctivae normal.  ?   Right eye: Right conjunctiva is not injected. No exudate or hemorrhage. ?   Left eye: Left conjunctiva is not injected. No exudate or hemorrhage. ?Cardiovascular:  ?   Rate and Rhythm: Normal rate and regular rhythm.  ?   Pulses: Normal pulses.     ?     Radial pulses are 2+ on the right side and 2+ on the left side.  ?     Dorsalis pedis pulses are 2+ on the right side and 2+ on the left side.  ?   Heart sounds: Normal heart sounds, S1 normal and S2 normal. Heart sounds not distant. No murmur heard. ?  No friction rub. No gallop. No S3 or S4 sounds.  ?Pulmonary:  ?   Effort: Pulmonary effort is normal. No accessory muscle usage or respiratory distress.  ?   Breath sounds: Normal breath sounds. No stridor. No wheezing, rhonchi or rales.  ?Chest:  ?   Chest wall: No tenderness.  ?Abdominal:  ?   General: Abdomen is flat. Bowel sounds are normal. There is no distension.  ?   Palpations: Abdomen is soft. There is no mass or pulsatile mass.  ?   Tenderness: There is no abdominal tenderness. There is no right CVA tenderness, left CVA tenderness, guarding or rebound.  ?Musculoskeletal:  ?   Right lower leg: No edema.  ?   Left lower leg: No edema.  ?Skin: ?   General: Skin is warm and dry.  ?   Coloration: Skin is not jaundiced or pale.  ?   Findings: No bruising, erythema, lesion or rash.  ?Neurological:  ?   General: No focal deficit present.  ?   Mental Status: She is alert and oriented to person, place, and time.  ?   GCS: GCS eye subscore is 4. GCS verbal subscore is 5. GCS motor subscore is 6.  ?Psychiatric:     ?   Mood and Affect: Mood normal.     ?   Behavior: Behavior normal. Behavior is cooperative.  ? ? ?ED Results / Procedures /  Treatments   ?Labs ?(all labs ordered are listed, but only abnormal results are displayed) ?Labs Reviewed  ?URINALYSIS, ROUTINE W REFLEX MICROSCOPIC - Abnormal; Notable for the following components:  ?    Result Value  ? Color, Urine AMBER (*)   ? Hgb urine dipstick TRACE (*)   ? Bilirubin Urine MODERATE (*)   ?  Ketones, ur 40 (*)   ? Protein, ur 30 (*)   ? All other components within normal limits  ?URINALYSIS, MICROSCOPIC (REFLEX) - Abnormal; Notable for the following components:  ? Bacteria, UA MANY (*)   ? All other components within normal limits  ?CBC WITH DIFFERENTIAL/PLATELET  ?COMPREHENSIVE METABOLIC PANEL  ?LIPASE, BLOOD  ? ? ?EKG ?None ? ?Radiology ?DG Chest 2 View ? ?Result Date: 03/22/2022 ?CLINICAL DATA:  Nausea and vomiting. Recent positive test for COVID-19. EXAM: CHEST - 2 VIEW COMPARISON:  None. FINDINGS: The heart size and mediastinal contours are within normal limits. Right-sided Port-A-Cath present with the catheter tip in the lower SVC. There is no evidence of pulmonary edema, consolidation, pneumothorax, nodule or pleural fluid. The visualized skeletal structures are unremarkable. IMPRESSION: No active cardiopulmonary disease. Electronically Signed   By: Aletta Edouard M.D.   On: 03/22/2022 15:40  ? ?CT Abdomen Pelvis W Contrast ? ?Result Date: 03/22/2022 ?CLINICAL DATA:  Nausea and vomiting.  COVID positive. EXAM: CT ABDOMEN AND PELVIS WITH CONTRAST TECHNIQUE: Multidetector CT imaging of the abdomen and pelvis was performed using the standard protocol following bolus administration of intravenous contrast. RADIATION DOSE REDUCTION: This exam was performed according to the departmental dose-optimization program which includes automated exposure control, adjustment of the mA and/or kV according to patient size and/or use of iterative reconstruction technique. CONTRAST:  161m OMNIPAQUE IOHEXOL 300 MG/ML  SOLN COMPARISON:  None. FINDINGS: Lower chest: The lung bases are clear of acute process. No  pleural effusion or pulmonary lesions. The heart is normal in size. No pericardial effusion. Moderate to large hiatal hernia noted. Hepatobiliary: No hepatic lesions or intrahepatic biliary dilatation. The gallbl

## 2022-03-22 NOTE — Discharge Instructions (Signed)
You can take your home Zofran and Phenergan as needed for nausea.  Try to eat and drink as tolerated.  Recommend follow-up appointment with your PCP.  If your symptoms continue to worsen or do not improve, he is return to the emergency department.Mallory Taylor ?

## 2022-03-24 ENCOUNTER — Other Ambulatory Visit: Payer: Self-pay

## 2022-03-24 ENCOUNTER — Emergency Department (HOSPITAL_BASED_OUTPATIENT_CLINIC_OR_DEPARTMENT_OTHER)
Admission: EM | Admit: 2022-03-24 | Discharge: 2022-03-24 | Disposition: A | Discharge status: Home / Self Care | Payer: BC Managed Care – PPO | Attending: Emergency Medicine | Admitting: Emergency Medicine

## 2022-03-24 ENCOUNTER — Encounter (HOSPITAL_BASED_OUTPATIENT_CLINIC_OR_DEPARTMENT_OTHER): Payer: Self-pay | Admitting: Emergency Medicine

## 2022-03-24 DIAGNOSIS — U071 COVID-19: Secondary | ICD-10-CM | POA: Diagnosis not present

## 2022-03-24 DIAGNOSIS — Z7901 Long term (current) use of anticoagulants: Secondary | ICD-10-CM | POA: Diagnosis not present

## 2022-03-24 DIAGNOSIS — K449 Diaphragmatic hernia without obstruction or gangrene: Secondary | ICD-10-CM | POA: Insufficient documentation

## 2022-03-24 DIAGNOSIS — N12 Tubulo-interstitial nephritis, not specified as acute or chronic: Secondary | ICD-10-CM

## 2022-03-24 DIAGNOSIS — K5730 Diverticulosis of large intestine without perforation or abscess without bleeding: Secondary | ICD-10-CM | POA: Diagnosis not present

## 2022-03-24 DIAGNOSIS — R197 Diarrhea, unspecified: Secondary | ICD-10-CM | POA: Diagnosis present

## 2022-03-24 DIAGNOSIS — E876 Hypokalemia: Secondary | ICD-10-CM

## 2022-03-24 HISTORY — DX: Malignant (primary) neoplasm, unspecified: C80.1

## 2022-03-24 LAB — CBC WITH DIFFERENTIAL/PLATELET
Abs Immature Granulocytes: 0 10*3/uL (ref 0.00–0.07)
Basophils Absolute: 0 10*3/uL (ref 0.0–0.1)
Basophils Relative: 0 %
Eosinophils Absolute: 0.1 10*3/uL (ref 0.0–0.5)
Eosinophils Relative: 2 %
HCT: 39.5 % (ref 36.0–46.0)
Hemoglobin: 12.9 g/dL (ref 12.0–15.0)
Immature Granulocytes: 0 %
Lymphocytes Relative: 37 %
Lymphs Abs: 1.9 10*3/uL (ref 0.7–4.0)
MCH: 28.6 pg (ref 26.0–34.0)
MCHC: 32.7 g/dL (ref 30.0–36.0)
MCV: 87.6 fL (ref 80.0–100.0)
Monocytes Absolute: 0.4 10*3/uL (ref 0.1–1.0)
Monocytes Relative: 8 %
Neutro Abs: 2.6 10*3/uL (ref 1.7–7.7)
Neutrophils Relative %: 53 %
Platelets: 230 10*3/uL (ref 150–400)
RBC: 4.51 MIL/uL (ref 3.87–5.11)
RDW: 12.6 % (ref 11.5–15.5)
WBC: 5 10*3/uL (ref 4.0–10.5)
nRBC: 0 % (ref 0.0–0.2)

## 2022-03-24 LAB — COMPREHENSIVE METABOLIC PANEL
ALT: 26 U/L (ref 0–44)
AST: 24 U/L (ref 15–41)
Albumin: 3.7 g/dL (ref 3.5–5.0)
Alkaline Phosphatase: 54 U/L (ref 38–126)
Anion gap: 6 (ref 5–15)
BUN: 9 mg/dL (ref 6–20)
CO2: 26 mmol/L (ref 22–32)
Calcium: 8.9 mg/dL (ref 8.9–10.3)
Chloride: 106 mmol/L (ref 98–111)
Creatinine, Ser: 0.71 mg/dL (ref 0.44–1.00)
GFR, Estimated: >60 mL/min (ref >60)
Glucose, Bld: 86 mg/dL (ref 70–99)
Potassium: 3.2 mmol/L — ABNORMAL LOW (ref 3.5–5.1)
Sodium: 138 mmol/L (ref 135–145)
Total Bilirubin: 0.8 mg/dL (ref 0.3–1.2)
Total Protein: 6.8 g/dL (ref 6.5–8.1)

## 2022-03-24 LAB — URINALYSIS, ROUTINE W REFLEX MICROSCOPIC
Glucose, UA: NEGATIVE mg/dL
Ketones, ur: NEGATIVE mg/dL
Nitrite: POSITIVE — AB
Protein, ur: 30 mg/dL — AB
Specific Gravity, Urine: 1.02 (ref 1.005–1.030)
pH: 6 (ref 5.0–8.0)

## 2022-03-24 LAB — URINALYSIS, MICROSCOPIC (REFLEX)

## 2022-03-24 LAB — LIPASE, BLOOD: Lipase: 40 U/L (ref 11–51)

## 2022-03-24 MED ORDER — POTASSIUM CHLORIDE CRYS ER 20 MEQ PO TBCR
40.0000 meq | EXTENDED_RELEASE_TABLET | Freq: Once | ORAL | Status: AC
  Administered 2022-03-24: 40 meq via ORAL
  Filled 2022-03-24: qty 2

## 2022-03-24 MED ORDER — SODIUM CHLORIDE 0.9 % IV SOLN: INTRAVENOUS | Status: DC | PRN

## 2022-03-24 MED ORDER — SODIUM CHLORIDE 0.9 % IV SOLN
1.0000 g | Freq: Once | INTRAVENOUS | Status: AC
  Administered 2022-03-24: 1 g via INTRAVENOUS
  Filled 2022-03-24: qty 10

## 2022-03-24 MED ORDER — SODIUM CHLORIDE 0.9 % IV BOLUS
1000.0000 mL | Freq: Once | INTRAVENOUS | Status: AC
  Administered 2022-03-24: 1000 mL via INTRAVENOUS

## 2022-03-24 MED ORDER — CEPHALEXIN 500 MG PO CAPS: 500.0000 mg | ORAL_CAPSULE | Freq: Three times a day (TID) | ORAL | 0 refills | Status: AC

## 2022-03-24 MED ORDER — HEPARIN SOD (PORK) LOCK FLUSH 100 UNIT/ML IV SOLN
500.0000 [IU] | Freq: Once | INTRAVENOUS | Status: AC
  Administered 2022-03-24: 500 [IU]
  Filled 2022-03-24: qty 5

## 2022-03-24 MED ORDER — ONDANSETRON HCL 4 MG/2ML IJ SOLN
4.0000 mg | Freq: Once | INTRAMUSCULAR | Status: AC
  Administered 2022-03-24: 4 mg via INTRAVENOUS
  Filled 2022-03-24: qty 2

## 2022-03-24 MED ORDER — TRAMADOL HCL 50 MG PO TABS
50.0000 mg | ORAL_TABLET | Freq: Once | ORAL | Status: AC
  Administered 2022-03-24: 50 mg via ORAL
  Filled 2022-03-24: qty 1

## 2022-03-24 MED ORDER — ONDANSETRON 4 MG PO TBDP
4.0000 mg | ORAL_TABLET | Freq: Once | ORAL | Status: AC | PRN
  Administered 2022-03-24: 4 mg via ORAL
  Filled 2022-03-24: qty 1

## 2022-03-24 NOTE — ED Provider Notes (Signed)
?[facility] ?Provider Note ? ? ?CSN: [id number] ?Arrival date & time: 03/24/22  1303 ? ?  ? ?History ? ?Chief Complaint  ?Patient presents with  ? Flank Pain  ? ? ?Mallory Taylor is a 51 y.o. female hx of lymphoma and recently on Taylor, Mallory presenting with flank pain.  Patient was diagnosed with COVID about a week ago.  Patient states that since then Taylor has not been doing well.  Taylor has been having nausea vomiting and diarrhea.  Taylor came to the ED 2 days ago had a CT abdomen pelvis that was unremarkable.  Her urinalysis shows some bacteria but since Taylor did not have any symptoms, patient was not given any antibiotics.  Taylor states that her nausea was under control until this afternoon.  Taylor states that Taylor started vomiting and having diarrhea again.  Taylor also has worsening left flank pain.  Denies any fevers.  Taylor states that Taylor is urinating frequently and has some mild dysuria now.  Denies any trouble breathing ? ?The history is provided by the patient.  ? ?  ? ?Home Medications ?Prior to Admission medications   ?Medication Sig Start Date End Date Taking? Authorizing Provider  ?ondansetron (ZOFRAN-ODT) 8 MG disintegrating tablet Take 1 tablet (8 mg total) by mouth every 8 (eight) hours as needed for up to 7 days for nausea or vomiting. 03/22/22 03/29/22 Yes Loeffler, Adora Fridge, PA-C  ?promethazine (PHENERGAN) 25 MG tablet Take 1 tablet (25 mg total) by mouth every 6 (six) hours as needed for nausea or vomiting. 06/28/21  Yes Quintella Reichert, MD  ?Rivaroxaban 15 & 20 MG TBPK Follow package directions: Take one '15mg'$  tablet by mouth twice a day. On day 22, switch to one '20mg'$  tablet once a day. Take with food. 11/27/19   Domenic Moras, PA-C  ?   ? ?Allergies    ?Other and Tixagevimab & cilgavimab   ? ?Review of Systems   ?Review of Systems  ?Genitourinary:  Positive for flank pain.  ?All other systems reviewed and are negative. ? ?Physical Exam ?Updated Vital Signs ?BP (!) 130/97    Pulse 75   Temp 98.1 ?F (36.7 ?C) (Oral)   Resp (!) 24   Ht '5\' 9"'$  (1.753 m)   Wt 105.7 kg   SpO2 100%   BMI 34.41 kg/m?  ?Physical Exam ?Vitals and nursing note reviewed.  ?Constitutional:   ?   Comments: Slightly uncomfortable, dehydrated  ?HENT:  ?   Head: Normocephalic.  ?   Nose: Nose normal.  ?   Mouth/Throat:  ?   Mouth: Mucous membranes are dry.  ?Eyes:  ?   Extraocular Movements: Extraocular movements intact.  ?   Pupils: Pupils are equal, round, and reactive to light.  ?Cardiovascular:  ?   Rate and Rhythm: Normal rate and regular rhythm.  ?   Pulses: Normal pulses.  ?   Heart sounds: Normal heart sounds.  ?Pulmonary:  ?   Effort: Pulmonary effort is normal.  ?   Breath sounds: Normal breath sounds.  ?Abdominal:  ?   General: Abdomen is flat.  ?   Palpations: Abdomen is soft.  ?   Comments: Mild left CVA tenderness  ?Musculoskeletal:     ?   General: Normal range of motion.  ?   Cervical back: Normal range of motion and neck supple.  ?Skin: ?   General: Skin is warm.  ?   Capillary Refill: Capillary refill takes less than 2 seconds.  ?  Neurological:  ?   General: No focal deficit present.  ?   Mental Status: Taylor is oriented to person, place, and time.  ?Psychiatric:     ?   Mood and Affect: Mood normal.     ?   Behavior: Behavior normal.  ? ? ?ED Results / Procedures / Treatments   ?Labs ?(all labs ordered are listed, but only abnormal results are displayed) ?Labs Reviewed  ?URINALYSIS, ROUTINE W REFLEX MICROSCOPIC - Abnormal; Notable for the following components:  ?    Result Value  ? APPearance HAZY (*)   ? Hgb urine dipstick TRACE (*)   ? Bilirubin Urine SMALL (*)   ? Protein, ur 30 (*)   ? Nitrite POSITIVE (*)   ? Leukocytes,Ua SMALL (*)   ? All other components within normal limits  ?URINALYSIS, MICROSCOPIC (REFLEX) - Abnormal; Notable for the following components:  ? Bacteria, UA MANY (*)   ? All other components within normal limits  ?CBC WITH DIFFERENTIAL/PLATELET  ?COMPREHENSIVE METABOLIC  PANEL  ?LIPASE, BLOOD  ? ? ?EKG ?None ? ?Radiology ?DG Chest 2 View ? ?Result Date: 03/22/2022 ?CLINICAL DATA:  Nausea and vomiting. Recent positive test for COVID-19. EXAM: CHEST - 2 VIEW COMPARISON:  None. FINDINGS: The heart size and mediastinal contours are within normal limits. Right-sided Port-A-Cath present with the catheter tip in the lower SVC. There is no evidence of pulmonary edema, consolidation, pneumothorax, nodule or pleural fluid. The visualized skeletal structures are unremarkable. IMPRESSION: No active cardiopulmonary disease. Electronically Signed   By: Aletta Edouard M.D.   On: 03/22/2022 15:40  ? ?CT Abdomen Pelvis W Contrast ? ?Result Date: 03/22/2022 ?CLINICAL DATA:  Nausea and vomiting.  COVID positive. EXAM: CT ABDOMEN AND PELVIS WITH CONTRAST TECHNIQUE: Multidetector CT imaging of the abdomen and pelvis was performed using the standard protocol following bolus administration of intravenous contrast. RADIATION DOSE REDUCTION: This exam was performed according to the departmental dose-optimization program which includes automated exposure control, adjustment of the mA and/or kV according to patient size and/or use of iterative reconstruction technique. CONTRAST:  190m OMNIPAQUE IOHEXOL 300 MG/ML  SOLN COMPARISON:  None. FINDINGS: Lower chest: The lung bases are clear of acute process. No pleural effusion or pulmonary lesions. The heart is normal in size. No pericardial effusion. Moderate to large hiatal hernia noted. Hepatobiliary: No hepatic lesions or intrahepatic biliary dilatation. The gallbladder is unremarkable. No common bile duct dilatation. Pancreas: No mass, inflammation or ductal dilatation. Spleen: Normal size.  No focal lesions. Adrenals/Urinary Tract: Adrenal glands are normal. No renal lesions or hydronephrosis. No renal or ureteral or bladder calculi. Stomach/Bowel: The stomach, duodenum, small bowel and colon are unremarkable. No acute inflammatory changes, mass lesions or  obstructive findings. The terminal ileum and appendix are normal. There is moderate sigmoid colon diverticulosis but no findings for acute diverticulitis. Vascular/Lymphatic: The aorta is normal in caliber. No dissection. The branch vessels are patent. The major venous structures are patent. No mesenteric or retroperitoneal mass or adenopathy. Small scattered lymph nodes are noted. Reproductive: The uterus is surgically absent. Both ovaries are still present and appear normal. Other: No pelvic mass or adenopathy. No free pelvic fluid collections. No inguinal mass or adenopathy. Small periumbilical abdominal wall hernia containing. Musculoskeletal: Chronic appearing compression fracture of L1. No acute bony findings or bone lesions. IMPRESSION: 1. No acute abdominal/pelvic findings, mass lesions or adenopathy. 2. Moderate to large hiatal hernia. 3. Sigmoid colon diverticulosis without findings for acute diverticulitis. Electronically Signed   By:  Marijo Sanes M.D.   On: 03/22/2022 15:42   ? ?Procedures ?Procedures  ? ? ?Medications Ordered in ED ?Medications  ?sodium chloride 0.9 % bolus 1,000 mL (has no administration in time range)  ?ondansetron (ZOFRAN) injection 4 mg (has no administration in time range)  ?cefTRIAXone (ROCEPHIN) 1 g in sodium chloride 0.9 % 100 mL IVPB (has no administration in time range)  ?ondansetron (ZOFRAN-ODT) disintegrating tablet 4 mg (4 mg Oral Given 03/24/22 1432)  ? ? ?ED Course/ Medical Decision Making/ A&P ?  ?                        ?Medical Decision Making ?Liane Tribbey is a 51 y.o. female [facility] presenting with flank pain and diarrhea.  Patient was positive for COVID about 5 days ago.  Patient was seen [facility] 2 days ago for similar symptoms and had unremarkable CT abdomen pelvis.  Patient has worsening dysuria and flank pain now.  Concern for possible pyelonephritis.  Patient has no trouble breathing and not require any oxygen and so will not require admission for COVID.  Plan to  get CBC and CMP and urinalysis and will hydrate patient and give Zofran and reassess ? ?4:57 PM ?Potassium 3.2 and was supplemented.  UA showed positive nitrates and many bacteria.  Since Taylor is symptomatic, I gave he

## 2022-03-24 NOTE — ED Notes (Signed)
Pt tolerating PO fluids

## 2022-03-24 NOTE — ED Triage Notes (Signed)
Patient arrives ambulatory by POV c/o bilateral flank and lower back pain. Patient states she is Covid + and was seen here. Reports diarrhea onset of Thursday night.  ?

## 2022-03-24 NOTE — Discharge Instructions (Addendum)
You have a kidney infection.  Take Keflex 3 times daily for a week ? ?Stay hydrated.  Continue Zofran as prescribed by your doctor ? ?See your doctor for follow up  ? ?Return to ER if you have worse flank pain, abdominal pain, vomiting, fever ?

## 2022-03-28 LAB — URINE CULTURE: Culture: 80000 — AB

## 2022-08-17 IMAGING — CT CT ABD-PELV W/ CM
2 of 5 series · 16 of 46 positions shown, 18 images · IV contrast (Omnipaque)
Comparison: None.

CLINICAL DATA: Nausea and vomiting.  COVID positive.

EXAM:
CT ABDOMEN AND PELVIS WITH CONTRAST
TECHNIQUE: Multidetector CT imaging of the abdomen and pelvis was performed
using the standard protocol following bolus administration of
intravenous contrast.

[Series 2: axial st · axial · 0.98mm/px · z∈[+740,+1135]mm · 13 of 89 slices shown, 15 images]
[im 5/89  soft-tissue]
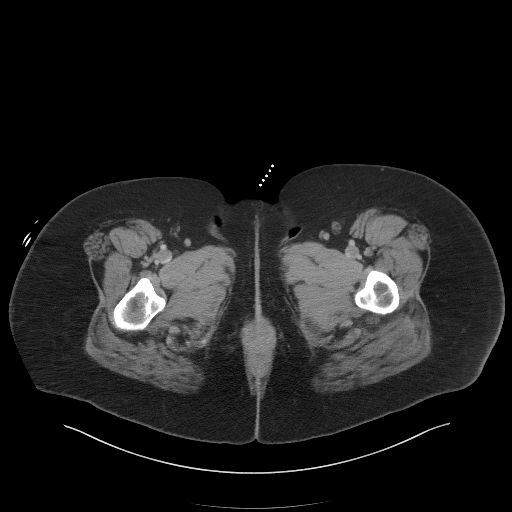
[im 5/89  bone]
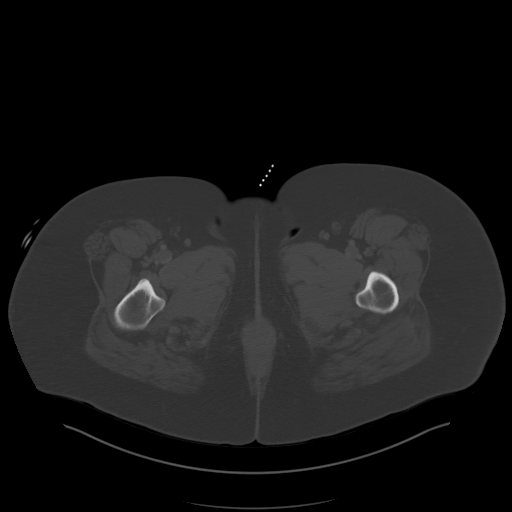
[im 14/89  soft-tissue]
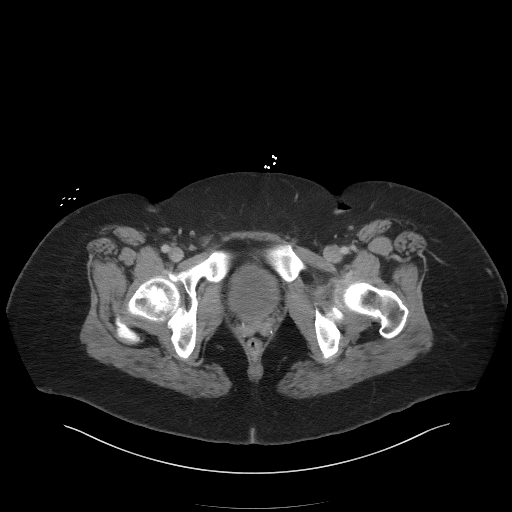
[im 18/89  soft-tissue]
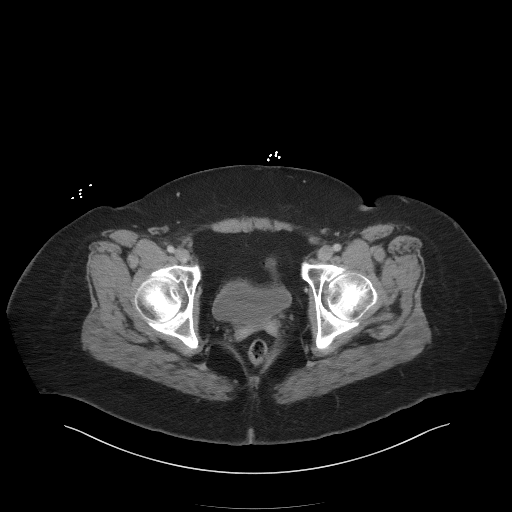
[im 27/89  soft-tissue]
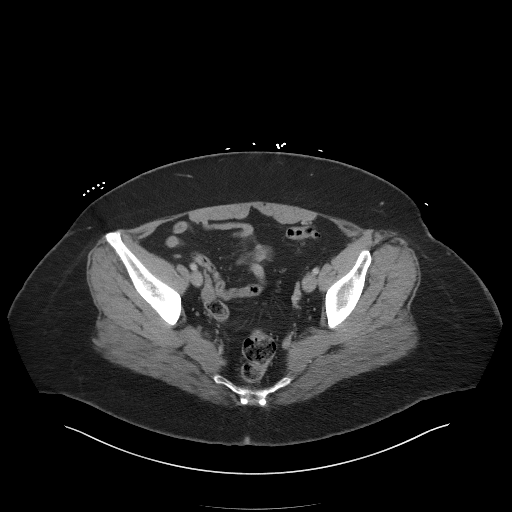
[im 31/89  soft-tissue]
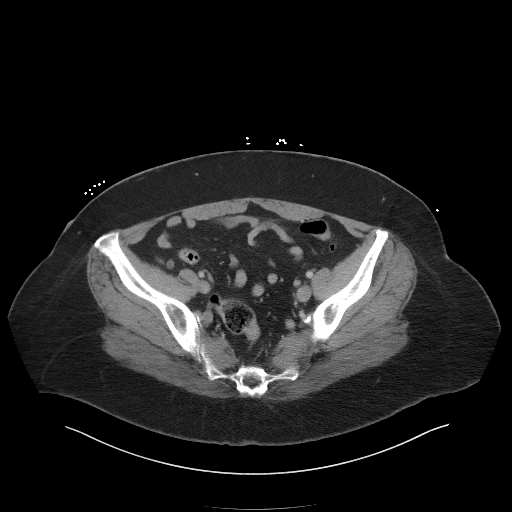
[im 40/89  soft-tissue]
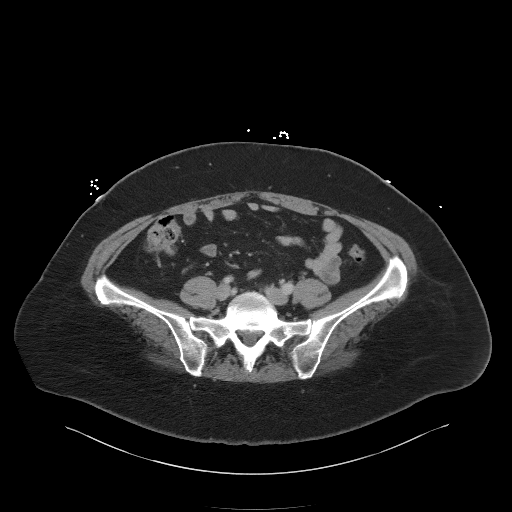
[im 45/89  soft-tissue]
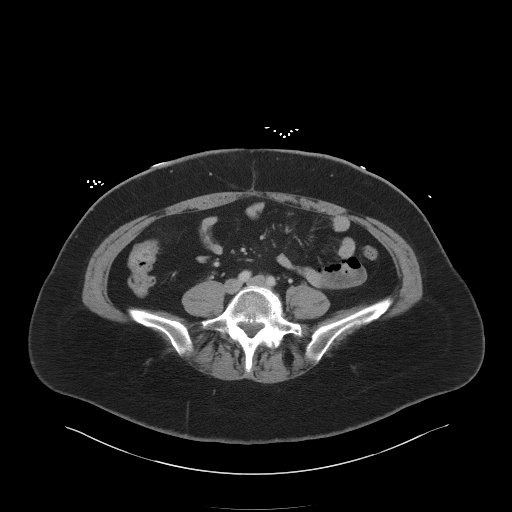
[im 49/89  soft-tissue]
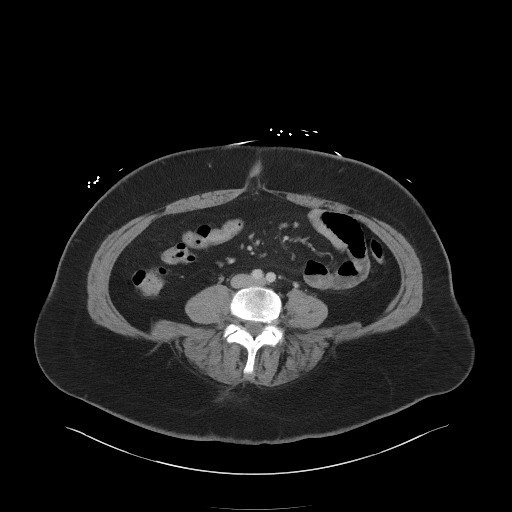
[im 58/89  soft-tissue]
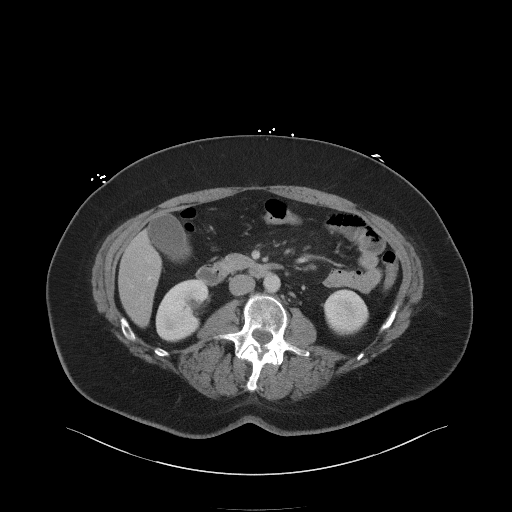
[im 58/89  bone]
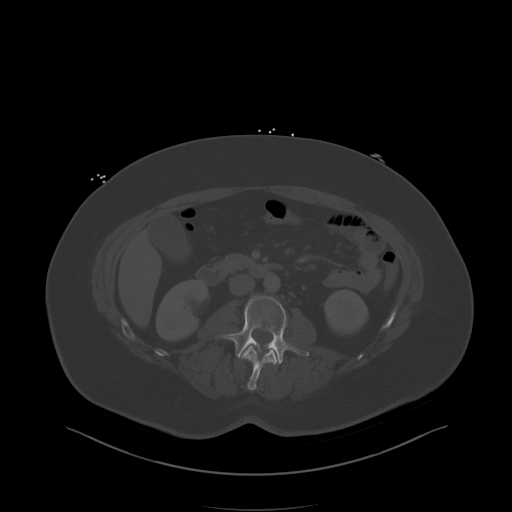
[im 62/89  soft-tissue]
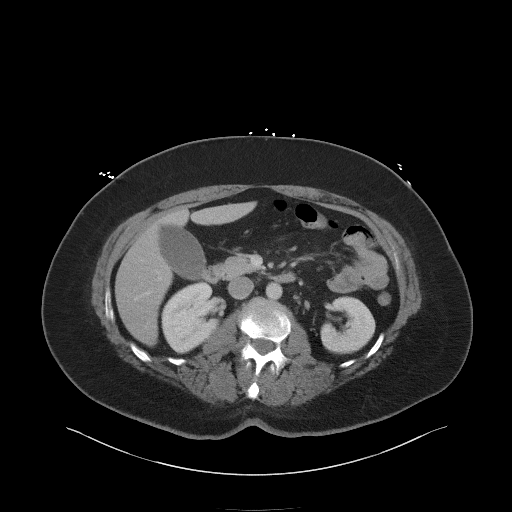
[im 71/89  soft-tissue]
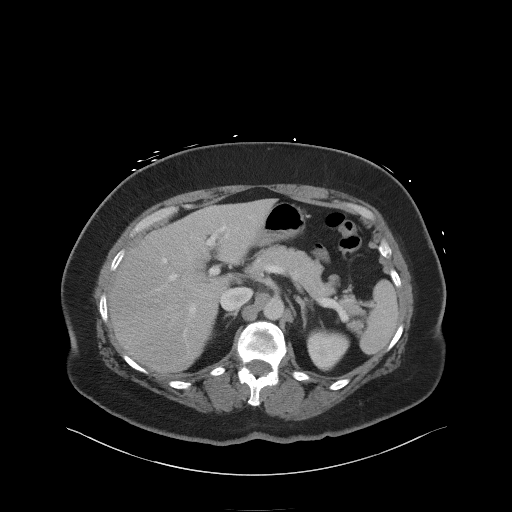
[im 75/89  soft-tissue]
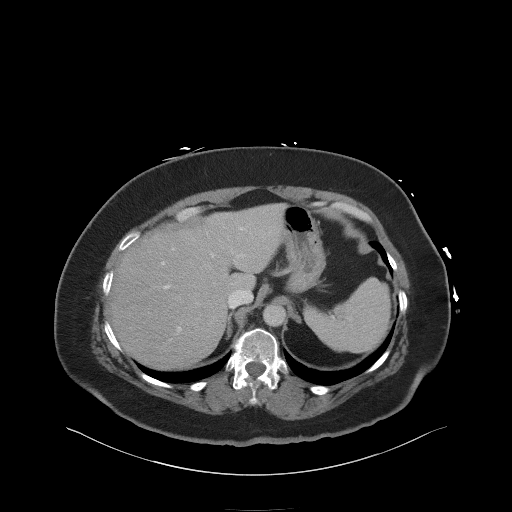
[im 84/89  soft-tissue]
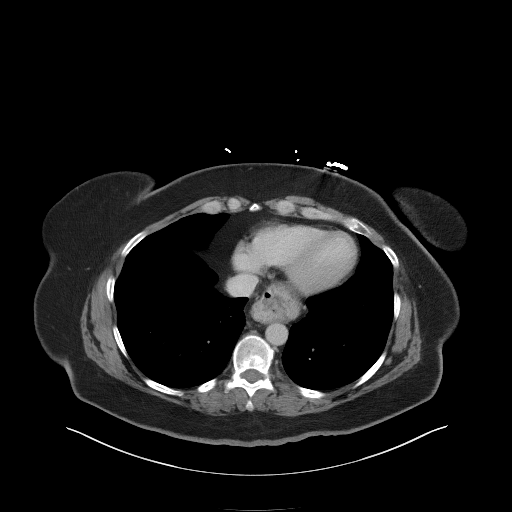

[Series 5: coronal st · coronal · 0.79mm/px · 3 of 101 slices shown]
[im 34/101  soft-tissue]
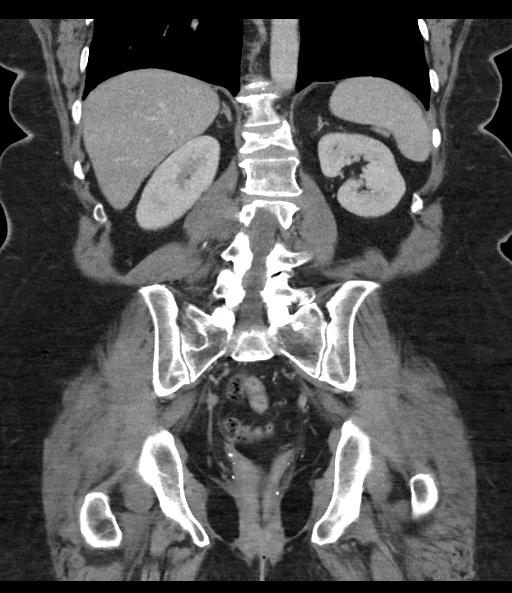
[im 45/101  soft-tissue]
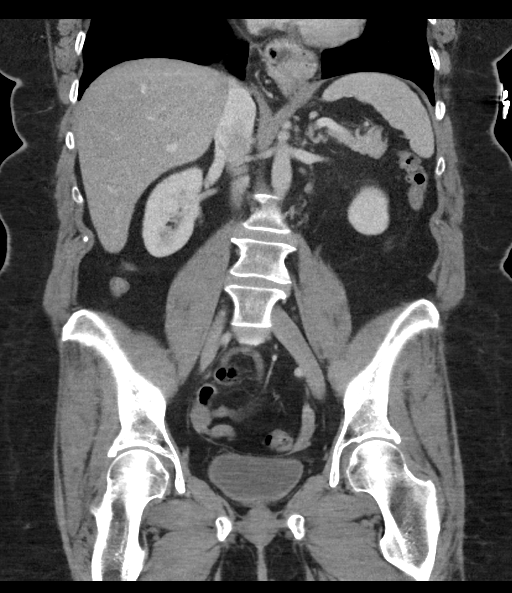
[im 56/101  soft-tissue]
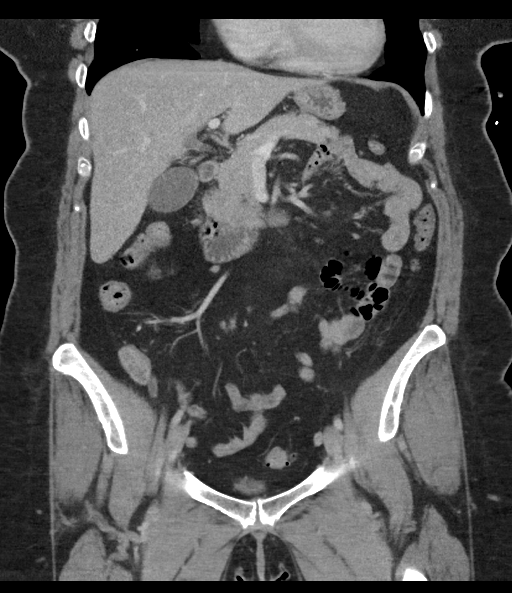

[16 of 46 positions shown; findings below may reference images not displayed]

RADIATION DOSE REDUCTION: This exam was performed according to the
departmental dose-optimization program which includes automated
exposure control, adjustment of the mA and/or kV according to
patient size and/or use of iterative reconstruction technique.

CONTRAST:  100mL OMNIPAQUE IOHEXOL 300 MG/ML  SOLN
FINDINGS: Lower chest: The lung bases are clear of acute process. No pleural
effusion or pulmonary lesions. The heart is normal in size. No
pericardial effusion. Moderate to large hiatal hernia noted.

Hepatobiliary: No hepatic lesions or intrahepatic biliary
dilatation. The gallbladder is unremarkable. No common bile duct
dilatation.

Pancreas: No mass, inflammation or ductal dilatation.

Spleen: Normal size.  No focal lesions.

Adrenals/Urinary Tract: Adrenal glands are normal.

No renal lesions or hydronephrosis. No renal or ureteral or bladder
calculi.

Stomach/Bowel: The stomach, duodenum, small bowel and colon are
unremarkable. No acute inflammatory changes, mass lesions or
obstructive findings. The terminal ileum and appendix are normal.
There is moderate sigmoid colon diverticulosis but no findings for
acute diverticulitis.

Vascular/Lymphatic: The aorta is normal in caliber. No dissection.
The branch vessels are patent. The major venous structures are
patent. No mesenteric or retroperitoneal mass or adenopathy. Small
scattered lymph nodes are noted.

Reproductive: The uterus is surgically absent. Both ovaries are
still present and appear normal.

Other: No pelvic mass or adenopathy. No free pelvic fluid
collections. No inguinal mass or adenopathy. Small periumbilical
abdominal wall hernia containing.

Musculoskeletal: Chronic appearing compression fracture of L1. No
acute bony findings or bone lesions.
IMPRESSION: 1. No acute abdominal/pelvic findings, mass lesions or adenopathy.
2. Moderate to large hiatal hernia.
3. Sigmoid colon diverticulosis without findings for acute
diverticulitis.

## 2022-09-24 ENCOUNTER — Emergency Department (HOSPITAL_BASED_OUTPATIENT_CLINIC_OR_DEPARTMENT_OTHER)
Admission: EM | Admit: 2022-09-24 | Discharge: 2022-09-24 | Disposition: A | Discharge status: Home / Self Care | Payer: BC Managed Care – PPO | Attending: Emergency Medicine | Admitting: Emergency Medicine

## 2022-09-24 ENCOUNTER — Emergency Department (HOSPITAL_BASED_OUTPATIENT_CLINIC_OR_DEPARTMENT_OTHER): Payer: BC Managed Care – PPO

## 2022-09-24 ENCOUNTER — Encounter (HOSPITAL_BASED_OUTPATIENT_CLINIC_OR_DEPARTMENT_OTHER): Payer: Self-pay | Admitting: Emergency Medicine

## 2022-09-24 ENCOUNTER — Other Ambulatory Visit: Payer: Self-pay

## 2022-09-24 DIAGNOSIS — Z7901 Long term (current) use of anticoagulants: Secondary | ICD-10-CM | POA: Diagnosis not present

## 2022-09-24 DIAGNOSIS — M79671 Pain in right foot: Secondary | ICD-10-CM | POA: Diagnosis not present

## 2022-09-24 DIAGNOSIS — M79604 Pain in right leg: Secondary | ICD-10-CM | POA: Insufficient documentation

## 2022-09-24 NOTE — ED Triage Notes (Signed)
Pt arrives pov, slow gait, reports referral by pcp to rule out RT foot DVT. Pt endorses wakened by foot pain last night. 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

## 2022-09-24 NOTE — ED Provider Notes (Signed)
[facility] EMERGENCY DEPARTMENT Provider Note   CSN: [id number] Arrival date & time: 09/24/22  1439     History  Chief Complaint  Patient presents with   Foot Pain    Mallory Taylor is a 51 y.o. female.   Foot Pain  Patient is a 51 year old female with a past medical history significant for lymphoma thought to be in remission at this time.  She states she has a history of a DVT in December 2020 which is when she was ultimately discovered to have lymphoma  She presents emergency room today due to right lower calf pain she states she feels that she has some pain and swelling in her right ankle.  She states that she awoke last night with some discomfort in her heel.  She states she had not had any trauma, new no new exercise, no injuries, abrasions or redness in this area per her.  She states that this area is still somewhat uncomfortable but less so.  More tender to touch.  She denies any chest pain difficulty breathing.  No lightheadedness or dizziness.  She denies any estrogen-containing medications she is a non-smoker.     Home Medications Prior to Admission medications   Medication Sig Start Date End Date Taking? Authorizing Provider  cephALEXin (KEFLEX) 500 MG capsule Take 1 capsule (500 mg total) by mouth 3 (three) times daily. 03/24/22   Charlynne Pander, MD  promethazine (PHENERGAN) 25 MG tablet Take 1 tablet (25 mg total) by mouth every 6 (six) hours as needed for nausea or vomiting. 06/28/21   Tilden Fossa, MD  Rivaroxaban 15 & 20 MG TBPK Follow package directions: Take one 15mg  tablet by mouth twice a day. On day 22, switch to one 20mg  tablet once a day. Take with food. 11/27/19   Fayrene Helper, PA-C      Allergies    Other and Tixagevimab & cilgavimab    Review of Systems   Review of Systems  Physical Exam Updated Vital Signs BP 139/65 (BP Location: Left Arm)   Pulse 72   Temp 98 F (36.7 C) (Oral)   Resp 18   Ht 5\' 9"  (1.753 m)   Wt 103 kg    SpO2 99%   BMI 33.52 kg/m  Physical Exam Vitals and nursing note reviewed.  Constitutional:      General: She is not in acute distress.    Appearance: Normal appearance. She is not ill-appearing.  HENT:     Head: Normocephalic and atraumatic.     Mouth/Throat:     Mouth: Mucous membranes are moist.  Eyes:     General: No scleral icterus.       Right eye: No discharge.        Left eye: No discharge.     Conjunctiva/sclera: Conjunctivae normal.  Pulmonary:     Effort: Pulmonary effort is normal.     Breath sounds: No stridor.  Musculoskeletal:     Right lower leg: No edema.     Left lower leg: No edema.  Skin:    Capillary Refill: Capillary refill takes less than 2 seconds.     Comments: No cellulitis of the leg.  There is some superficial varicosities  DP, PT pulses 3+ and symmetric, cap refill less than 2 seconds in bilateral great toes  Neurological:     Mental Status: She is alert and oriented to person, place, and time. Mental status is at baseline.     ED Results / Procedures /  Treatments   Labs (all labs ordered are listed, but only abnormal results are displayed) Labs Reviewed - No data to display  EKG None  Radiology No results found.  Procedures Procedures    Medications Ordered in ED Medications - No data to display  ED Course/ Medical Decision Making/ A&P Clinical Course as of 09/24/22 2138  Mon Sep 24, 2022  1537 R foot pain that started during the night last night.   Hx of DVT in dec 2020. Likely 2/2 lymphoma.  [WF]  1726 Ultrasound underway currently [WF]  1818 IMPRESSION: Negative.   Electronically Signed   By: Lupita Raider M.D.   On: 09/24/2022 18:08   [WF]    Clinical Course User Index [WF] Gailen Shelter, PA                           Medical Decision Making  Patient is a 51 year old female with a past medical history significant for lymphoma thought to be in remission at this time.  She states she has a history of a DVT  in December 2020 which is when she was ultimately discovered to have lymphoma  She presents emergency room today due to right lower calf pain she states she feels that she has some pain and swelling in her right ankle.  She states that she awoke last night with some discomfort in her heel.  She states she had not had any trauma, new no new exercise, no injuries, abrasions or redness in this area per her.  She states that this area is still somewhat uncomfortable but less so.  More tender to touch.  She denies any chest pain difficulty breathing.  No lightheadedness or dizziness.  She denies any estrogen-containing medications she is a non-smoker.  Physical exam with good distal perfusion and sensory exam.  Good range of motion.  Seems to be some tenderness and the right Achilles tendon area.  However she also endorses some tenderness with palpation of the calf.  Right lower extremity ultrasound will be obtained to rule out DVT.    Right lower extremity ultrasound without DVT.  Patient is distally neurovascularly intact.  Query some sort of MSK pain.  No trauma, compartments are soft.  Will discharge home with follow-up with PCP.  Conservative therapy at this time.  Final Clinical Impression(s) / ED Diagnoses Final diagnoses:  Right leg pain    Rx / DC Orders ED Discharge Orders     None         Gailen Shelter, [place] 09/24/22 2139    Linwood Dibbles, MD 09/26/22 0700

## 2022-09-24 NOTE — Discharge Instructions (Signed)
Your ultrasound was negative for a DVT.  I recommend cool compresses, Tylenol and ibuprofen as discussed below and follow-up with your primary care doctor.  Please use Tylenol or ibuprofen for pain.  You may use 600 mg ibuprofen every 6 hours or 1000 mg of Tylenol every 6 hours.  You may choose to alternate between the 2.  This would be most effective.  Not to exceed 4 g of Tylenol within 24 hours.  Not to exceed 3200 mg ibuprofen 24 hours.   You may always return to the emergency room for any new or concerning symptoms.

## 2023-10-03 ENCOUNTER — Emergency Department (HOSPITAL_BASED_OUTPATIENT_CLINIC_OR_DEPARTMENT_OTHER)
Admission: EM | Admit: 2023-10-03 | Discharge: 2023-10-03 | Disposition: A | Discharge status: Home / Self Care | Payer: BC Managed Care – PPO | Attending: Emergency Medicine | Admitting: Emergency Medicine

## 2023-10-03 ENCOUNTER — Emergency Department (HOSPITAL_BASED_OUTPATIENT_CLINIC_OR_DEPARTMENT_OTHER): Payer: BC Managed Care – PPO

## 2023-10-03 ENCOUNTER — Other Ambulatory Visit: Payer: Self-pay

## 2023-10-03 ENCOUNTER — Encounter (HOSPITAL_BASED_OUTPATIENT_CLINIC_OR_DEPARTMENT_OTHER): Payer: Self-pay

## 2023-10-03 DIAGNOSIS — R0789 Other chest pain: Secondary | ICD-10-CM | POA: Diagnosis present

## 2023-10-03 DIAGNOSIS — R911 Solitary pulmonary nodule: Secondary | ICD-10-CM | POA: Diagnosis not present

## 2023-10-03 DIAGNOSIS — R079 Chest pain, unspecified: Secondary | ICD-10-CM

## 2023-10-03 LAB — CBC WITH DIFFERENTIAL/PLATELET
Abs Immature Granulocytes: 0.03 10*3/uL (ref 0.00–0.07)
Basophils Absolute: 0 10*3/uL (ref 0.0–0.1)
Basophils Relative: 0 %
Eosinophils Absolute: 0.1 10*3/uL (ref 0.0–0.5)
Eosinophils Relative: 1 %
HCT: 39.8 % (ref 36.0–46.0)
Hemoglobin: 12.7 g/dL (ref 12.0–15.0)
Immature Granulocytes: 0 %
Lymphocytes Relative: 19 %
Lymphs Abs: 2.1 10*3/uL (ref 0.7–4.0)
MCH: 28.5 pg (ref 26.0–34.0)
MCHC: 31.9 g/dL (ref 30.0–36.0)
MCV: 89.4 fL (ref 80.0–100.0)
Monocytes Absolute: 0.9 10*3/uL (ref 0.1–1.0)
Monocytes Relative: 8 %
Neutro Abs: 7.6 10*3/uL (ref 1.7–7.7)
Neutrophils Relative %: 72 %
Platelets: 250 10*3/uL (ref 150–400)
RBC: 4.45 MIL/uL (ref 3.87–5.11)
RDW: 12.5 % (ref 11.5–15.5)
WBC: 10.7 10*3/uL — ABNORMAL HIGH (ref 4.0–10.5)
nRBC: 0 % (ref 0.0–0.2)

## 2023-10-03 LAB — COMPREHENSIVE METABOLIC PANEL
ALT: 22 U/L (ref 0–44)
AST: 18 U/L (ref 15–41)
Albumin: 4.1 g/dL (ref 3.5–5.0)
Alkaline Phosphatase: 69 U/L (ref 38–126)
Anion gap: 10 (ref 5–15)
BUN: 14 mg/dL (ref 6–20)
CO2: 24 mmol/L (ref 22–32)
Calcium: 9.5 mg/dL (ref 8.9–10.3)
Chloride: 103 mmol/L (ref 98–111)
Creatinine, Ser: 0.84 mg/dL (ref 0.44–1.00)
GFR, Estimated: >60 mL/min (ref >60)
Glucose, Bld: 90 mg/dL (ref 70–99)
Potassium: 4 mmol/L (ref 3.5–5.1)
Sodium: 137 mmol/L (ref 135–145)
Total Bilirubin: 1.1 mg/dL (ref 0.3–1.2)
Total Protein: 7.7 g/dL (ref 6.5–8.1)

## 2023-10-03 LAB — TROPONIN I (HIGH SENSITIVITY)
Troponin I (High Sensitivity): 10 ng/L (ref <18)
Troponin I (High Sensitivity): 13 ng/L (ref <18)

## 2023-10-03 LAB — D-DIMER, QUANTITATIVE: D-Dimer, Quant: 0.52 ug{FEU}/mL — ABNORMAL HIGH (ref 0.00–0.50)

## 2023-10-03 MED ORDER — ACETAMINOPHEN 500 MG PO TABS
1000.0000 mg | ORAL_TABLET | Freq: Once | ORAL | Status: AC
Start: 2023-10-03 — End: 2023-10-03
  Administered 2023-10-03: 1000 mg via ORAL
  Filled 2023-10-03: qty 2

## 2023-10-03 MED ORDER — KETOROLAC TROMETHAMINE 30 MG/ML IJ SOLN
30.0000 mg | Freq: Once | INTRAMUSCULAR | Status: AC
Start: 2023-10-03 — End: 2023-10-03
  Administered 2023-10-03: 30 mg via INTRAVENOUS
  Filled 2023-10-03: qty 1

## 2023-10-03 MED ORDER — CYCLOBENZAPRINE HCL 10 MG PO TABS: 10.0000 mg | ORAL_TABLET | Freq: Two times a day (BID) | ORAL | 0 refills | Status: AC | PRN

## 2023-10-03 MED ORDER — IOHEXOL 350 MG/ML SOLN
100.0000 mL | Freq: Once | INTRAVENOUS | Status: AC | PRN
  Administered 2023-10-03: 73 mL via INTRAVENOUS

## 2023-10-03 NOTE — ED Notes (Signed)
Discharge instructions reviewed with patient. Patient verbalizes understanding, no further questions at this time. Medications/prescriptions and follow up information provided. No acute distress noted at time of departure.  

## 2023-10-03 NOTE — ED Provider Notes (Signed)
[name] EMERGENCY DEPARTMENT AT [facility] Provider Note   CSN: [id number] Arrival date & time: 10/03/23  4098     History  Chief Complaint  Patient presents with   Chest Pain    Mallory Taylor is a 52 y.o. female.  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  On my history, reports it is a pain like she had been punched in the chest and is sharp and worse with deep breaths.  It has been constant since yesterday.  It is worse with movements, particularly big movements like twisting bending.  If she gets in the right position it does feel better.  She denies any heavy lifting or other trauma to the chest.  Denies shortness of breath, nausea, vomiting, leg pain or swelling, fever, cough.  Her prior DVT was when she was diagnosed with lymphoma, and also after having a foot surgery and she is no longer on anticoagulation.  Denies any recent surgeries or immobilization with the exception that she flew back from [place] on October 11  Denies family history of early heart disease, smoking, significant alcohol use or other drugs  Past Medical History:  Diagnosis Date   Cancer (HCC)      Home Medications Prior to Admission medications   Medication Sig Start Date End Date Taking? Authorizing Provider  cyclobenzaprine (FLEXERIL) 10 MG tablet Take 1 tablet (10 mg total) by mouth 2 (two) times daily as needed for muscle spasms. 10/03/23  Yes Alvira Monday, MD  cephALEXin (KEFLEX) 500 MG capsule Take 1 capsule (500 mg total) by mouth 3 (three) times daily. 03/24/22   Charlynne Pander, MD  promethazine (PHENERGAN) 25 MG tablet Take 1 tablet (25 mg total) by mouth every 6 (six) hours as needed for nausea  or vomiting. 06/28/21   Tilden Fossa, MD  Rivaroxaban 15 & 20 MG TBPK Follow package directions: Take one 15mg  tablet by mouth twice a day. On day 22, switch to one 20mg  tablet once a day. Take with food. 11/27/19   Fayrene Helper, PA-C      Allergies    Other and Tixagevimab & cilgavimab    Review of Systems   Review of Systems  Physical Exam Updated Vital Signs BP 121/89   Pulse 85   Temp 97.9 F (36.6 C) (Oral)   Resp 18   Ht 5\' 9"  (1.753 m)   Wt 110.7 kg   SpO2 98%   BMI 36.03 kg/m  Physical Exam Vitals and nursing note reviewed.  Constitutional:      General: She is not in acute distress.    Appearance: She is well-developed. She is not diaphoretic.  HENT:     Head: Normocephalic and atraumatic.  Eyes:     Conjunctiva/sclera: Conjunctivae normal.  Cardiovascular:     Rate and Rhythm: Normal rate and regular rhythm.     Heart sounds: Normal heart sounds. No murmur heard.    No friction rub. No gallop.  Pulmonary:     Effort: Pulmonary effort is normal. No respiratory distress.     Breath sounds: Normal breath sounds. No wheezing or rales.  Chest:     Chest wall: Tenderness present.  Abdominal:     General: There is no distension.  Palpations: Abdomen is soft.     Tenderness: There is no abdominal tenderness. There is no guarding.  Musculoskeletal:        General: No tenderness.     Cervical back: Normal range of motion.  Skin:    General: Skin is warm and dry.     Findings: No erythema or rash.  Neurological:     Mental Status: She is alert and oriented to person, place, and time.     ED Results / Procedures / Treatments   Labs (all labs ordered are listed, but only abnormal results are displayed) Labs Reviewed  CBC WITH DIFFERENTIAL/PLATELET - Abnormal; Notable for the following components:      Result Value   WBC 10.7 (*)    All other components within normal limits  D-DIMER, QUANTITATIVE - Abnormal; Notable for the following components:    D-Dimer, Quant 0.52 (*)    All other components within normal limits  COMPREHENSIVE METABOLIC PANEL  TROPONIN I (HIGH SENSITIVITY)  TROPONIN I (HIGH SENSITIVITY)    EKG None  Radiology CT Angio Chest PE W and/or Wo Contrast  Result Date: 10/03/2023 CLINICAL DATA:  Pulmonary embolism (PE) suspected, low to intermediate prob, positive D-dimer. Chest pain. EXAM: CT ANGIOGRAPHY CHEST WITH CONTRAST TECHNIQUE: Multidetector CT imaging of the chest was performed using the standard protocol during bolus administration of intravenous contrast. Multiplanar CT image reconstructions and MIPs were obtained to evaluate the vascular anatomy. RADIATION DOSE REDUCTION: This exam was performed according to the departmental dose-optimization program which includes automated exposure control, adjustment of the mA and/or kV according to patient size and/or use of iterative reconstruction technique. CONTRAST:  73mL OMNIPAQUE IOHEXOL 350 MG/ML SOLN COMPARISON:  None Available. FINDINGS: Cardiovascular: Satisfactory opacification of the pulmonary arteries to the segmental level. No evidence of pulmonary embolism. Normal heart size. No pericardial effusion. Mediastinum/Nodes: No enlarged mediastinal, hilar, or axillary lymph nodes. Moderate-sized hiatal hernia. Trachea demonstrates no abnormalities. Lungs/Pleura: There is a 4 mm subpleural nodule in the left lower lobe and a 4 mm nodule in the right upper lobe (series 303, images 82 and series 601, image 82). No focal consolidation. No pneumothorax or pleural effusion. Upper Abdomen: No acute abnormality. Musculoskeletal: No suspicious osseous lesion. No acute osseous abnormality. Review of the MIP images confirms the above findings. IMPRESSION: 1. No evidence of acute pulmonary embolism. 2. 4 mm pulmonary nodules in the left lower and right upper lobes. No follow-up needed if patient is low-risk (and has no known or suspected primary neoplasm). Non-contrast chest CT can be  considered in 12 months if patient is high-risk. This recommendation follows the consensus statement: Guidelines for Management of Incidental Pulmonary Nodules Detected on CT Images: From the Fleischner Society 2017; Radiology 2017; 284:228-243. 3. Moderate-sized hiatal hernia. Electronically Signed   By: Hart Robinsons M.D.   On: 10/03/2023 13:07   DG Chest 2 View  Result Date: 10/03/2023 CLINICAL DATA:  Chest pain. EXAM: CHEST - 2 VIEW COMPARISON:  Chest radiograph dated March 22, 2022 FINDINGS: The heart size and mediastinal contours are within normal limits. Both lungs are clear. No pneumothorax or pleural effusion. Similar remote compression deformity of the L1 vertebral body. No acute osseous abnormality. IMPRESSION: No acute cardiopulmonary disease. Electronically Signed   By: Hart Robinsons M.D.   On: 10/03/2023 10:59    Procedures Procedures    Medications Ordered in ED Medications  iohexol (OMNIPAQUE) 350 MG/ML injection 100 mL (73 mLs Intravenous Contrast Given 10/03/23 1131)  acetaminophen (TYLENOL)  tablet 1,000 mg (1,000 mg Oral Given 10/03/23 1239)  ketorolac (TORADOL) 30 MG/ML injection 30 mg (30 mg Intravenous Given 10/03/23 1404)    ED Course/ Medical Decision Making/ A&P                                    52 year old female with a history of follicular lymphoma, DVT, mixed hyperlipidemia, anxiety, compression fracture of L1 who presents with concern for chest pain  Differential diagnosis for chest pain includes pulmonary embolus, dissection, pneumothorax, pneumonia, ACS, myocarditis, pericarditis.    EKG was done and evaluate by me and showed no acute ST changes and no signs of pericarditis.   Chest x-ray was done and evaluated by me and radiology and showed no sign of pneumonia or pneumothorax.   Labs completed and personally evaluated by me show no clinically significant anemia, transaminitis or electrolyte abnormalities. Mild leukocytosis.  Her troponin is 10  and 13, doubt ACS. Do not feel history or exam are consistent with aortic dissection.  D-dimer is mildly positive at 0.52.  Given technically positive D-dimer with history of long travel, prior DVT, pleuritic pain, will obtain CT PE study for further evaluation.  CT PE study completed and shows no evidence of PE. Does show lung nodule recommend 12 month follow up. Hiatal hernia also noted.   Pain worse with palpation, twisting, movement, is right sided, pleuritic. Suspect pleurisy vs msk vs reflux from hiatal hernia. CP is atypical for acs, troponin negative. Given rx for flexeril, recommend ibuprofen and tylenol, PCP follow up and discussed strict return precautions.  Patient discharged in stable condition with understanding of reasons to return.          Final Clinical Impression(s) / ED Diagnoses Final diagnoses:  Nonspecific chest pain  Pulmonary nodule    Rx / DC Orders ED Discharge Orders          Ordered    cyclobenzaprine (FLEXERIL) 10 MG tablet  2 times daily PRN        10/03/23 1341              Alvira Monday, MD 10/03/23 2248

## 2023-10-03 NOTE — ED Triage Notes (Signed)
C/o constant midsternal chest pain since yesterday, pain worse with deep breathing.

## 2023-12-06 ENCOUNTER — Other Ambulatory Visit: Payer: Self-pay

## 2023-12-06 ENCOUNTER — Emergency Department (HOSPITAL_BASED_OUTPATIENT_CLINIC_OR_DEPARTMENT_OTHER): Payer: BC Managed Care – PPO

## 2023-12-06 ENCOUNTER — Emergency Department (HOSPITAL_BASED_OUTPATIENT_CLINIC_OR_DEPARTMENT_OTHER)
Admission: EM | Admit: 2023-12-06 | Discharge: 2023-12-06 | Disposition: A | Discharge status: Home / Self Care | Payer: BC Managed Care – PPO | Attending: Emergency Medicine | Admitting: Emergency Medicine

## 2023-12-06 DIAGNOSIS — R350 Frequency of micturition: Secondary | ICD-10-CM | POA: Diagnosis not present

## 2023-12-06 DIAGNOSIS — M545 Low back pain, unspecified: Secondary | ICD-10-CM | POA: Insufficient documentation

## 2023-12-06 DIAGNOSIS — Z7901 Long term (current) use of anticoagulants: Secondary | ICD-10-CM | POA: Diagnosis not present

## 2023-12-06 DIAGNOSIS — R300 Dysuria: Secondary | ICD-10-CM | POA: Insufficient documentation

## 2023-12-06 DIAGNOSIS — R109 Unspecified abdominal pain: Secondary | ICD-10-CM | POA: Diagnosis not present

## 2023-12-06 LAB — CBC WITH DIFFERENTIAL/PLATELET
Abs Immature Granulocytes: 0.01 10*3/uL (ref 0.00–0.07)
Basophils Absolute: 0.1 10*3/uL (ref 0.0–0.1)
Basophils Relative: 1 %
Eosinophils Absolute: 0.3 10*3/uL (ref 0.0–0.5)
Eosinophils Relative: 5 %
HCT: 37.7 % (ref 36.0–46.0)
Hemoglobin: 12.3 g/dL (ref 12.0–15.0)
Immature Granulocytes: 0 %
Lymphocytes Relative: 41 %
Lymphs Abs: 2.3 10*3/uL (ref 0.7–4.0)
MCH: 29 pg (ref 26.0–34.0)
MCHC: 32.6 g/dL (ref 30.0–36.0)
MCV: 88.9 fL (ref 80.0–100.0)
Monocytes Absolute: 0.5 10*3/uL (ref 0.1–1.0)
Monocytes Relative: 9 %
Neutro Abs: 2.4 10*3/uL (ref 1.7–7.7)
Neutrophils Relative %: 44 %
Platelets: 240 10*3/uL (ref 150–400)
RBC: 4.24 MIL/uL (ref 3.87–5.11)
RDW: 12.5 % (ref 11.5–15.5)
WBC: 5.6 10*3/uL (ref 4.0–10.5)
nRBC: 0 % (ref 0.0–0.2)

## 2023-12-06 LAB — URINALYSIS, ROUTINE W REFLEX MICROSCOPIC
Bilirubin Urine: NEGATIVE
Glucose, UA: NEGATIVE mg/dL
Hgb urine dipstick: NEGATIVE
Ketones, ur: NEGATIVE mg/dL
Leukocytes,Ua: NEGATIVE
Nitrite: NEGATIVE
Protein, ur: NEGATIVE mg/dL
Specific Gravity, Urine: 1.01 (ref 1.005–1.030)
pH: 5.5 (ref 5.0–8.0)

## 2023-12-06 LAB — COMPREHENSIVE METABOLIC PANEL
ALT: 21 U/L (ref 0–44)
AST: 19 U/L (ref 15–41)
Albumin: 3.8 g/dL (ref 3.5–5.0)
Alkaline Phosphatase: 62 U/L (ref 38–126)
Anion gap: 5 (ref 5–15)
BUN: 13 mg/dL (ref 6–20)
CO2: 26 mmol/L (ref 22–32)
Calcium: 9.6 mg/dL (ref 8.9–10.3)
Chloride: 107 mmol/L (ref 98–111)
Creatinine, Ser: 0.8 mg/dL (ref 0.44–1.00)
GFR, Estimated: >60 mL/min (ref >60)
Glucose, Bld: 88 mg/dL (ref 70–99)
Potassium: 3.7 mmol/L (ref 3.5–5.1)
Sodium: 138 mmol/L (ref 135–145)
Total Bilirubin: 0.7 mg/dL (ref <1.2)
Total Protein: 6.7 g/dL (ref 6.5–8.1)

## 2023-12-06 LAB — LIPASE, BLOOD: Lipase: 36 U/L (ref 11–51)

## 2023-12-06 MED ORDER — ONDANSETRON HCL 4 MG/2ML IJ SOLN
4.0000 mg | Freq: Once | INTRAMUSCULAR | Status: AC
  Administered 2023-12-06: 4 mg via INTRAVENOUS
  Filled 2023-12-06: qty 2

## 2023-12-06 MED ORDER — KETOROLAC TROMETHAMINE 15 MG/ML IJ SOLN
15.0000 mg | Freq: Once | INTRAMUSCULAR | Status: AC
  Administered 2023-12-06: 15 mg via INTRAVENOUS
  Filled 2023-12-06: qty 1

## 2023-12-06 NOTE — ED Notes (Addendum)
Been on Keflex from Friday through Tuesday, was on Cipro from [facility]-today, and 1x shot of Rocephin on Tuesday. Still having bilateral flank tenderness over kidneys to palpation and percussion. No hematuria anymore. No strong odors. Peeing clear now. Afebrile today at [facility] but had chills last night.   Urgent care sent her over for a CT.

## 2023-12-06 NOTE — Discharge Instructions (Addendum)
You are seen in the emergency room today for back pain.  Please continue taking ibuprofen and Tylenol for pain control.  You can try ice and heat over area of pain.  You can also buy lidocaine patches over-the-counter or topical options. You do not have urinary tract infection or sign of kidney stones on your CT. Your lab work is normal. Please follow-up with primary care to ensure resolution of symptoms and return to emergency room with any new or worsening symptoms.

## 2023-12-06 NOTE — ED Notes (Addendum)
RT Note- 

## 2023-12-06 NOTE — ED Triage Notes (Signed)
Pt sent by [facility] Pt reports lower back x 2 week. Pain across lower and radiating to abdomen. Keflex from Friday, then rochepin Im and cipro from Tuesday until today. Initially dark urine, now clear

## 2023-12-06 NOTE — ED Notes (Signed)

## 2023-12-06 NOTE — ED Provider Notes (Signed)
[place] [facility] AT [facility] Provider Note   CSN: [id number] Arrival date & time: 12/06/23  6295     History  Chief Complaint  Patient presents with   Back Pain    Mallory Taylor is a 52 y.o. female past medical history of lymphoma presenting to emergency room with 2 weeks of low back pain.  Denies any additional injury trauma or fall.  Patient reports back pain is bilateral and radiates to abdomen.  Has associated dysuria. Reports urinary frequency at baseline, denies incontience.  Has been treated for urinary tract infection with urgent care however has not really had improvement of symptoms.  Denies any fevers, chills, abdominal pain, nausea vomiting diarrhea.  Has not noticed any blood in her urine or stools. No change in bowel movement.  Patient reports she was sent here by urgent care to rule out kidney stone.   Back Pain      Home Medications Prior to Admission medications   Medication Sig Start Date End Date Taking? Authorizing Provider  cephALEXin (KEFLEX) 500 MG capsule Take 1 capsule (500 mg total) by mouth 3 (three) times daily. 03/24/22   Charlynne Pander, MD  cyclobenzaprine (FLEXERIL) 10 MG tablet Take 1 tablet (10 mg total) by mouth 2 (two) times daily as needed for muscle spasms. 10/03/23   Alvira Monday, MD  promethazine (PHENERGAN) 25 MG tablet Take 1 tablet (25 mg total) by mouth every 6 (six) hours as needed for nausea or vomiting. 06/28/21   Tilden Fossa, MD  Rivaroxaban 15 & 20 MG TBPK Follow package directions: Take one 15mg  tablet by mouth twice a day. On day 22, switch to one 20mg  tablet once a day. Take with food. 11/27/19   Fayrene Helper, PA-C      Allergies    Other and Tixagevimab & cilgavimab    Review of Systems   Review of Systems  Musculoskeletal:  Positive for back pain.    Physical Exam Updated Vital Signs BP (!) 142/87 (BP Location: Right Arm)   Pulse 66   Temp 97.8 F (36.6 C) (Oral)   Resp 14    Ht 5\' 9"  (1.753 m)   Wt 107.5 kg   SpO2 100%   BMI 35.00 kg/m  Physical Exam Vitals and nursing note reviewed.  Constitutional:      General: She is not in acute distress.    Appearance: She is not toxic-appearing.  HENT:     Head: Normocephalic and atraumatic.  Eyes:     General: No scleral icterus.    Conjunctiva/sclera: Conjunctivae normal.  Cardiovascular:     Rate and Rhythm: Normal rate and regular rhythm.     Pulses: Normal pulses.     Heart sounds: Normal heart sounds.  Pulmonary:     Effort: Pulmonary effort is normal. No respiratory distress.     Breath sounds: Normal breath sounds.  Abdominal:     General: Abdomen is flat. Bowel sounds are normal. There is no distension.     Palpations: Abdomen is soft. There is no mass.     Tenderness: There is no abdominal tenderness. There is no right CVA tenderness or left CVA tenderness.  Musculoskeletal:     Comments: No midline tenderness over the lumbar spine, no step-off or deformity.  Patient has tenderness to palpation over bilateral musculature.   Skin:    General: Skin is warm and dry.     Findings: No lesion.  Neurological:     General: No  focal deficit present.     Mental Status: She is alert and oriented to person, place, and time. Mental status is at baseline.     ED Results / Procedures / Treatments   Labs (all labs ordered are listed, but only abnormal results are displayed) Labs Reviewed  URINALYSIS, ROUTINE W REFLEX MICROSCOPIC  COMPREHENSIVE METABOLIC PANEL  LIPASE, BLOOD  CBC WITH DIFFERENTIAL/PLATELET    EKG None  Radiology No results found.  Procedures Procedures    Medications Ordered in ED Medications  ketorolac (TORADOL) 15 MG/ML injection 15 mg (has no administration in time range)  ondansetron (ZOFRAN) injection 4 mg (has no administration in time range)    ED Course/ Medical Decision Making/ A&P                                 Medical Decision Making Amount and/or Complexity  of Data Reviewed Labs: ordered. Radiology: ordered.  Risk Prescription drug management.   Martie Lee 52 y.o. presented today for back pain. Working DDx includes, but not limited to, gastroenteritis, colitis, SBO, appendicitis, cholecystitis, hepatobiliary pathology, gastritis, PUD, ACS, dissection, pancreatitis, nephrolithiasis, AAA, UTI, pyelonephritis.  R/o DDx: These are considered less likely than current impression due to history of present illness, physical exam, labs/imaging findings.  Review of prior external notes: 12/06/2023 urgent care visit for same  Pmhx: denies  Unique Tests and My Interpretation:  CBC without leukocytosis, CMP unremarkable, urinalysis without nitrites noted leukocytes and no red blood cells.  Lipase 36  Imaging:  CT Renal Study: due to favored nephrolithiasis over GI etiology for patient's abdominal pain - no abnormality.    Problem List / ED Course / Critical interventions / Medication management  Reporting to emergency room with bilateral low back pain.  Patient reports it has been ongoing for 2 weeks.  Not associated with decreased appetite nausea vomiting or diarrhea.  Patient has no additional injury.  On exam no CVA tenderness although she does have tenderness to palpation over bilateral musculature of low back.  No red flag symptoms of back pain - no saddle anesthesia, incontinence change in bowel movements, fever, IVDU.  Obtaining CT and labs to rule out kidney stone/ pathology then re-assess.  No kidney stone on CT scan.  Labs very reassuring.  Patients pain well-controlled.  ER.  Will send with outpatient follow-up. I ordered medication including toradol  for pain  Reevaluation of the patient after these medicines showed that the patient improved Patients vitals assessed. Upon arrival patient is hemodynamically stable.  I have reviewed the patients home medicines and have made adjustments as needed    Consult: None  Plan:  F/u w/  PCP in 2-3d to ensure resolution of sx.  Patient was given return precautions. Patient stable for discharge at this time.  Patient educated on current sx/dx and verbalized understanding of plan. Return to ER w/ new or worsening sx.          Final Clinical Impression(s) / ED Diagnoses Final diagnoses:  Acute bilateral low back pain without sciatica    Rx / DC Orders ED Discharge Orders     None         Smitty Knudsen, PA-C 12/06/23 1216

## 2024-01-14 ENCOUNTER — Other Ambulatory Visit: Payer: Self-pay | Admitting: Medical Genetics

## 2024-01-24 ENCOUNTER — Other Ambulatory Visit (HOSPITAL_COMMUNITY): Payer: Self-pay | Attending: Medical Genetics

## 2024-10-08 ENCOUNTER — Other Ambulatory Visit: Payer: Self-pay | Admitting: Medical Genetics

## 2024-10-08 DIAGNOSIS — Z006 Encounter for examination for normal comparison and control in clinical research program: Secondary | ICD-10-CM
